# Patient Record
Sex: Male | Born: 1970 | Race: White | Hispanic: No | Marital: Married | Smoking: Former smoker
Health system: Southern US, Community
[De-identification: ages and names within clinical notes are randomized; demographics above are authoritative.]

## PROBLEM LIST (undated history)

## (undated) DIAGNOSIS — B029 Zoster without complications: Secondary | ICD-10-CM

## (undated) DIAGNOSIS — L409 Psoriasis, unspecified: Secondary | ICD-10-CM

## (undated) DIAGNOSIS — F419 Anxiety disorder, unspecified: Secondary | ICD-10-CM

## (undated) DIAGNOSIS — E291 Testicular hypofunction: Secondary | ICD-10-CM

## (undated) HISTORY — DX: Zoster without complications: B02.9

## (undated) HISTORY — DX: Psoriasis, unspecified: L40.9

## (undated) HISTORY — DX: Testicular hypofunction: E29.1

## (undated) HISTORY — DX: Anxiety disorder, unspecified: F41.9

---
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
## 2010-11-14 DIAGNOSIS — L409 Psoriasis, unspecified: Secondary | ICD-10-CM

## 2010-11-14 HISTORY — DX: Psoriasis, unspecified: L40.9

## 2015-11-15 DIAGNOSIS — B029 Zoster without complications: Secondary | ICD-10-CM

## 2015-11-15 HISTORY — DX: Zoster without complications: B02.9

## 2016-11-29 DIAGNOSIS — Z79891 Long term (current) use of opiate analgesic: Secondary | ICD-10-CM | POA: Diagnosis not present

## 2016-12-08 DIAGNOSIS — Z79891 Long term (current) use of opiate analgesic: Secondary | ICD-10-CM | POA: Diagnosis not present

## 2016-12-28 DIAGNOSIS — Z79891 Long term (current) use of opiate analgesic: Secondary | ICD-10-CM | POA: Diagnosis not present

## 2017-01-23 DIAGNOSIS — Z79891 Long term (current) use of opiate analgesic: Secondary | ICD-10-CM | POA: Diagnosis not present

## 2017-01-25 DIAGNOSIS — Z79891 Long term (current) use of opiate analgesic: Secondary | ICD-10-CM | POA: Diagnosis not present

## 2017-02-20 DIAGNOSIS — Z79891 Long term (current) use of opiate analgesic: Secondary | ICD-10-CM | POA: Diagnosis not present

## 2017-02-22 DIAGNOSIS — Z79891 Long term (current) use of opiate analgesic: Secondary | ICD-10-CM | POA: Diagnosis not present

## 2017-03-20 DIAGNOSIS — Z79891 Long term (current) use of opiate analgesic: Secondary | ICD-10-CM | POA: Diagnosis not present

## 2017-03-22 DIAGNOSIS — Z79891 Long term (current) use of opiate analgesic: Secondary | ICD-10-CM | POA: Diagnosis not present

## 2017-04-17 DIAGNOSIS — Z79891 Long term (current) use of opiate analgesic: Secondary | ICD-10-CM | POA: Diagnosis not present

## 2017-04-26 DIAGNOSIS — Z79891 Long term (current) use of opiate analgesic: Secondary | ICD-10-CM | POA: Diagnosis not present

## 2017-05-22 DIAGNOSIS — Z79891 Long term (current) use of opiate analgesic: Secondary | ICD-10-CM | POA: Diagnosis not present

## 2017-05-24 DIAGNOSIS — Z79891 Long term (current) use of opiate analgesic: Secondary | ICD-10-CM | POA: Diagnosis not present

## 2017-06-19 DIAGNOSIS — Z79891 Long term (current) use of opiate analgesic: Secondary | ICD-10-CM | POA: Diagnosis not present

## 2017-06-21 DIAGNOSIS — Z79891 Long term (current) use of opiate analgesic: Secondary | ICD-10-CM | POA: Diagnosis not present

## 2017-06-24 DIAGNOSIS — N410 Acute prostatitis: Secondary | ICD-10-CM | POA: Diagnosis not present

## 2017-07-10 ENCOUNTER — Ambulatory Visit (INDEPENDENT_AMBULATORY_CARE_PROVIDER_SITE_OTHER): Payer: 59 | Admitting: Family Medicine

## 2017-07-10 ENCOUNTER — Encounter: Payer: Self-pay | Admitting: Family Medicine

## 2017-07-10 VITALS — BP 144/90 | HR 96 | Temp 98.1°F | Resp 16 | Ht 75.5 in | Wt 285.0 lb

## 2017-07-10 DIAGNOSIS — L409 Psoriasis, unspecified: Secondary | ICD-10-CM | POA: Diagnosis not present

## 2017-07-10 DIAGNOSIS — Z7689 Persons encountering health services in other specified circumstances: Secondary | ICD-10-CM | POA: Diagnosis not present

## 2017-07-10 DIAGNOSIS — Z114 Encounter for screening for human immunodeficiency virus [HIV]: Secondary | ICD-10-CM

## 2017-07-10 DIAGNOSIS — E669 Obesity, unspecified: Secondary | ICD-10-CM

## 2017-07-10 DIAGNOSIS — R030 Elevated blood-pressure reading, without diagnosis of hypertension: Secondary | ICD-10-CM

## 2017-07-10 DIAGNOSIS — F419 Anxiety disorder, unspecified: Secondary | ICD-10-CM

## 2017-07-10 DIAGNOSIS — Z6835 Body mass index (BMI) 35.0-35.9, adult: Secondary | ICD-10-CM

## 2017-07-10 MED ORDER — CLOBETASOL PROPIONATE 0.05 % EX OINT: 1.0000 "application " | TOPICAL_OINTMENT | Freq: Two times a day (BID) | CUTANEOUS | 0 refills | Status: DC

## 2017-07-10 MED ORDER — CLOBETASOL PROPIONATE 0.05 % EX SOLN: 1.0000 "application " | Freq: Two times a day (BID) | CUTANEOUS | 0 refills | Status: DC

## 2017-07-10 NOTE — Assessment & Plan Note (Signed)
Stable on clobetasol Advised on precautions for infectious symptoms and joint symptoms

## 2017-07-10 NOTE — Progress Notes (Signed)
Patient: Ronald Mccoy, Male    DOB: February 10, 1971, 46 y.o.   MRN: [id number] Visit Date: 07/10/2017  Today's Provider: Shirlee Latch, MD   Chief Complaint  Patient presents with  . Establish Care  . Annual Exam   Subjective:    Annual physical exam Ronald Mccoy is a 46 y.o. male who presents today for health maintenance and complete physical. He feels well (other than white coat syndrome). He reports he was exercising on the elliptical for 10 minutes per night, but he has not done this in about 2 weeks. He reports he is sleeping well.  Pt went to [facility] on 06/24/2017 for prostatitis. He was treated with Bactrim, with relief of symptoms. They advised pt to FU with PCP and get a physical.. He was seeing Doristine Mango, NP at [facility], but pt prefers to see an MD. ----------------------------------------------------------------- Anxiety Previously tried Buspar, Klonopin, Effexor - didn't seem to help Not interested in medications at this time  Psoriasis - using clobetasol BID which keeps it under control - no joint symptoms   Review of Systems  Constitutional: Negative.   HENT: Negative.   Eyes: Negative.   Respiratory: Negative.   Cardiovascular: Negative.   Gastrointestinal: Negative.   Endocrine: Negative.   Genitourinary: Negative.   Musculoskeletal: Negative.   Skin: Negative.   Allergic/Immunologic: Negative.   Neurological: Negative.   Hematological: Negative.   Psychiatric/Behavioral: Negative.     Social History      He  reports that he has quit smoking. He has never used smokeless tobacco. He reports that he does not drink alcohol or use drugs.       Social History   Social History  . Marital status: Married    Spouse name: April  . Number of children: 2  . Years of education: 14   Occupational History  .  [employer]   Social History Main Topics  . Smoking status: Former Games developer  . Smokeless tobacco: Never Used     Comment: was social smoker  . Alcohol use No  . Drug use: No  . Sexual activity: Not Asked   Other Topics Concern  . None   Social History Narrative  . None    History reviewed. No pertinent past medical history.   There are no active problems to display for this patient.   Past Surgical History:  Procedure Laterality Date  . TONSILLECTOMY      Family History        Family Status  Relation Status  . Mother Alive  . Father Deceased  . Brother Alive  . Brother Alive  . Brother Alive        His family history includes Breast cancer in his mother; COPD in his father; Cirrhosis in his father; Healthy in his brother, brother, and brother; Heart attack (age of onset: 73) in his father.     No Known Allergies   Current Outpatient Prescriptions:  Marland Kitchen  Multiple Vitamin (MULTIVITAMIN) tablet, Take 1 tablet by mouth daily., Disp: , Rfl:  .  Probiotic Product (PROBIOTIC ADVANCED PO), Take by mouth., Disp: , Rfl:    Patient Care Team: Erasmo Downer, MD as PCP - General (Family Medicine)      Objective:   Vitals: BP (!) 144/90 (BP Location: Left Arm, Patient Position: Sitting, Cuff Size: Large) Comment: white coat syndrome per pt  Pulse 96   Temp 98.1 F (36.7 C) (Oral)  Resp 16   Ht 6' 3.5" (1.918 m)   Wt 285 lb (129.3 kg)   SpO2 96%   BMI 35.15 kg/m    Vitals:   07/10/17 1454  BP: (!) 144/90  Pulse: 96  Resp: 16  Temp: 98.1 F (36.7 C)  TempSrc: Oral  SpO2: 96%  Weight: 285 lb (129.3 kg)  Height: 6' 3.5" (1.918 m)     Physical Exam  Constitutional: He is oriented to person, place, and time. He appears well-developed and well-nourished. No distress.  HENT:  Head: Normocephalic and atraumatic.  Right Ear: External ear normal.  Left Ear: External ear normal.  Nose: Nose normal.  Mouth/Throat: Oropharynx is clear and moist.  Eyes: Conjunctivae and EOM are normal. No scleral icterus.  Neck: Neck supple. No thyromegaly present.  Cardiovascular:  Normal rate, regular rhythm, normal heart sounds and intact distal pulses.   No murmur heard. Pulmonary/Chest: Effort normal and breath sounds normal. No respiratory distress. He has no wheezes. He has no rales.  Abdominal: Soft. Bowel sounds are normal. He exhibits no distension. There is no tenderness. There is no rebound and no guarding.  Musculoskeletal: He exhibits no edema or deformity.  Lymphadenopathy:    He has no cervical adenopathy.  Neurological: He is alert and oriented to person, place, and time.  Skin: Skin is warm and dry.  + plaque psoriasis  Psychiatric: He has a normal mood and affect. His behavior is normal.  Vitals reviewed.   Depression Screen PHQ 2/9 Scores 07/10/2017  PHQ - 2 Score 0     Assessment & Plan:     Routine Health Maintenance and Physical Exam  Exercise Activities and Dietary recommendations Goals    None       There is no immunization history on file for this patient.  Health Maintenance  Topic Date Due  . HIV Screening  05/05/1987  . TETANUS/TDAP  05/05/1991  . INFLUENZA VACCINE  06/14/2017     Discussed health benefits of physical activity, and encouraged him to engage in regular exercise appropriate for his age and condition.   Psoriasis Stable on clobetasol Advised on precautions for infectious symptoms and joint symptoms  Obesity Discussed healthy diet and exercise Screening lipid panel, CMP  Elevated BP without diagnosis of hypertension We will monitor Patient reports this is white coat HTN - wife is RN who states BP is normal at home Could consider ambulatory BP monitoring  Anxiety Refuses medications at this time Consider therapy in the future    Addressed extensive list of chronic and acute medical problems today requiring extensive time in counseling and coordination care.  Over half of this 45 minute visit were spent in counseling and coordinating care of multiple medical  problems. --------------------------------------------------------------------  The entirety of the information documented in the History of Present Illness, Review of Systems and Physical Exam were personally obtained by me. Portions of this information were initially documented by Irving Burton Ratchford, CMA and reviewed by me for thoroughness and accuracy.    Shirlee Latch, MD  [facility]

## 2017-07-10 NOTE — Patient Instructions (Signed)
Psoriasis Psoriasis is a long-term (chronic) condition of skin inflammation. It occurs because your immune system causes skin cells to form too quickly. As a result, too many skin cells grow and create raised, red patches (plaques) that look silvery on your skin. Plaques may appear anywhere on your body. They can be any size or shape. Psoriasis can come and go. The condition varies from mild to very severe. It cannot be passed from one person to another (not contagious). What are the causes? The cause of psoriasis is not known, but certain factors can make the condition worse. These include:  Damage or trauma to the skin, such as cuts, scrapes, sunburn, and dryness.  Lack of sunlight.  Certain medicines.  Alcohol.  Tobacco use.  Stress.  Infections caused by bacteria or viruses.  What increases the risk? This condition is more likely to develop in:  People with a family history of psoriasis.  People who are Caucasian.  People who are between the ages of 15-30 and 50-60 years old.  What are the signs or symptoms? There are five different types of psoriasis. You can have more than one type of psoriasis during your life. Types are:  Plaque.  Guttate.  Inverse.  Pustular.  Erythrodermic.  Each type of psoriasis has different symptoms.  Plaque psoriasis symptoms include red, raised plaques with a silvery white coating (scale). These plaques may be itchy. Your nails may be pitted and crumbly or fall off.  Guttate psoriasis symptoms include small red spots that often show up on your trunk, arms, and legs. These spots may develop after you have been sick, especially with strep throat.  Inverse psoriasis symptoms include plaques in your underarm area, under your breasts, or on your genitals, groin, or buttocks.  Pustular psoriasis symptoms include pus-filled bumps that are painful, red, and swollen on the palms of your hands or the soles of your feet. You also may feel  exhausted, feverish, weak, or have no appetite.  Erythrodermic psoriasis symptoms include bright red skin that may look burned. You may have a fast heartbeat and a body temperature that is too high or too low. You may be itchy or in pain.  How is this diagnosed? Your health care provider may suspect psoriasis based on your symptoms and family history. Your health care provider will also do a physical exam. This may include a procedure to remove a tissue sample (biopsy) for testing. You may also be referred to a health care provider who specializes in skin diseases (dermatologist). How is this treated? There is no cure for this condition, but treatment can help manage it. Goals of treatment include:  Helping your skin heal.  Reducing itching and inflammation.  Slowing the growth of new skin cells.  Helping your immune system respond better to your skin.  Treatment varies, depending on the severity of your condition. Treatment may include:  Creams or ointments.  Ultraviolet ray exposure (light therapy). This may include natural sunlight or light therapy in a medical office.  Medicines (systemic therapy). These medicines can help your body better manage skin cell turnover and inflammation. They may be used along with light therapy or ointments. You may also get antibiotic medicines if you have an infection.  Follow these instructions at home: Skin Care  Moisturize your skin as needed. Only use moisturizers that have been approved by your health care provider.  Apply cool compresses to the affected areas.  Do not scratch your skin. Lifestyle   Do not   use tobacco products. This includes cigarettes, chewing tobacco, and e-cigarettes. If you need help quitting, ask your health care provider.  Drink little or no alcohol.  Try techniques for stress reduction, such as meditation or yoga.  Get exposure to the sun as told by your health care provider. Do not get sunburned.  Consider  joining a psoriasis support group. Medicines  Take or use over-the-counter and prescription medicines only as told by your health care provider.  If you were prescribed an antibiotic, take or use it as told by your health care provider. Do not stop taking the antibiotic even if your condition starts to improve. General instructions  Keep a journal to help track what triggers an outbreak. Try to avoid any triggers.  See a counselor or social worker if feelings of sadness, frustration, and hopelessness about your condition are interfering with your work and relationships.  Keep all follow-up visits as told by your health care provider. This is important. Contact a health care provider if:  Your pain gets worse.  You have increasing redness or warmth in the affected areas.  You have new or worsening pain or stiffness in your joints.  Your nails start to break easily or pull away from the nail bed.  You have a fever.  You feel depressed. This information is not intended to replace advice given to you by your health care provider. Make sure you discuss any questions you have with your health care provider. Document Released: 10/28/2000 Document Revised: 04/07/2016 Document Reviewed: 03/18/2015 Elsevier Interactive Patient Education  2018 [employer]  

## 2017-07-11 LAB — CBC WITH DIFFERENTIAL/PLATELET
BASOS ABS: 0 10*3/uL (ref 0.0–0.2)
Basos: 0 %
EOS (ABSOLUTE): 0.2 10*3/uL (ref 0.0–0.4)
Eos: 2 %
HEMATOCRIT: 41.3 % (ref 37.5–51.0)
HEMOGLOBIN: 14.5 g/dL (ref 13.0–17.7)
Immature Grans (Abs): 0 10*3/uL (ref 0.0–0.1)
Immature Granulocytes: 0 %
LYMPHS ABS: 2.7 10*3/uL (ref 0.7–3.1)
Lymphs: 29 %
MCH: 28.8 pg (ref 26.6–33.0)
MCHC: 35.1 g/dL (ref 31.5–35.7)
MCV: 82 fL (ref 79–97)
MONOCYTES: 10 %
MONOS ABS: 1 10*3/uL — AB (ref 0.1–0.9)
NEUTROS ABS: 5.5 10*3/uL (ref 1.4–7.0)
Neutrophils: 59 %
Platelets: 253 10*3/uL (ref 150–379)
RBC: 5.04 x10E6/uL (ref 4.14–5.80)
RDW: 13.1 % (ref 12.3–15.4)
WBC: 9.4 10*3/uL (ref 3.4–10.8)

## 2017-07-11 LAB — HIV ANTIBODY (ROUTINE TESTING W REFLEX): HIV Screen 4th Generation wRfx: NONREACTIVE

## 2017-07-11 LAB — COMPREHENSIVE METABOLIC PANEL
A/G RATIO: 1.2 (ref 1.2–2.2)
ALK PHOS: 79 IU/L (ref 39–117)
ALT: 15 IU/L (ref 0–44)
AST: 22 IU/L (ref 0–40)
Albumin: 4.4 g/dL (ref 3.5–5.5)
BILIRUBIN TOTAL: 0.4 mg/dL (ref 0.0–1.2)
BUN/Creatinine Ratio: 11 (ref 9–20)
BUN: 9 mg/dL (ref 6–24)
CHLORIDE: 98 mmol/L (ref 96–106)
CO2: 27 mmol/L (ref 20–29)
Calcium: 9.5 mg/dL (ref 8.7–10.2)
Creatinine, Ser: 0.85 mg/dL (ref 0.76–1.27)
GFR calc non Af Amer: 105 mL/min/{1.73_m2} (ref >59)
GFR, EST AFRICAN AMERICAN: 122 mL/min/{1.73_m2} (ref >59)
GLOBULIN, TOTAL: 3.8 g/dL (ref 1.5–4.5)
GLUCOSE: 109 mg/dL — AB (ref 65–99)
Potassium: 4.3 mmol/L (ref 3.5–5.2)
Sodium: 140 mmol/L (ref 134–144)
TOTAL PROTEIN: 8.2 g/dL (ref 6.0–8.5)

## 2017-07-11 LAB — LIPID PANEL
CHOL/HDL RATIO: 3.8 ratio (ref 0.0–5.0)
CHOLESTEROL TOTAL: 145 mg/dL (ref 100–199)
HDL: 38 mg/dL — ABNORMAL LOW (ref >39)
LDL CALC: 76 mg/dL (ref 0–99)
Triglycerides: 157 mg/dL — ABNORMAL HIGH (ref 0–149)
VLDL Cholesterol Cal: 31 mg/dL (ref 5–40)

## 2017-07-11 NOTE — Assessment & Plan Note (Signed)
We will monitor Patient reports this is white coat HTN - wife is RN who states BP is normal at home Could consider ambulatory BP monitoring

## 2017-07-11 NOTE — Assessment & Plan Note (Signed)
Refuses medications at this time Consider therapy in the future

## 2017-07-11 NOTE — Assessment & Plan Note (Signed)
Discussed healthy diet and exercise Screening lipid panel, CMP

## 2017-07-12 ENCOUNTER — Encounter: Payer: Self-pay | Admitting: Family Medicine

## 2017-07-18 DIAGNOSIS — Z79891 Long term (current) use of opiate analgesic: Secondary | ICD-10-CM | POA: Diagnosis not present

## 2017-07-19 DIAGNOSIS — Z79891 Long term (current) use of opiate analgesic: Secondary | ICD-10-CM | POA: Diagnosis not present

## 2017-08-11 DIAGNOSIS — M5430 Sciatica, unspecified side: Secondary | ICD-10-CM | POA: Diagnosis not present

## 2017-08-11 DIAGNOSIS — M791 Myalgia: Secondary | ICD-10-CM | POA: Diagnosis not present

## 2017-08-11 DIAGNOSIS — M9905 Segmental and somatic dysfunction of pelvic region: Secondary | ICD-10-CM | POA: Diagnosis not present

## 2017-08-15 DIAGNOSIS — Z79891 Long term (current) use of opiate analgesic: Secondary | ICD-10-CM | POA: Diagnosis not present

## 2017-08-27 ENCOUNTER — Encounter: Payer: Self-pay | Admitting: Family Medicine

## 2017-11-29 DIAGNOSIS — Z79891 Long term (current) use of opiate analgesic: Secondary | ICD-10-CM | POA: Diagnosis not present

## 2017-12-08 ENCOUNTER — Encounter: Payer: Self-pay | Admitting: Family Medicine

## 2018-01-02 DIAGNOSIS — Z79899 Other long term (current) drug therapy: Secondary | ICD-10-CM | POA: Diagnosis not present

## 2018-01-15 DIAGNOSIS — D485 Neoplasm of uncertain behavior of skin: Secondary | ICD-10-CM | POA: Diagnosis not present

## 2018-01-15 DIAGNOSIS — D034 Melanoma in situ of scalp and neck: Secondary | ICD-10-CM | POA: Diagnosis not present

## 2018-01-15 DIAGNOSIS — C44619 Basal cell carcinoma of skin of left upper limb, including shoulder: Secondary | ICD-10-CM | POA: Diagnosis not present

## 2018-01-15 DIAGNOSIS — L570 Actinic keratosis: Secondary | ICD-10-CM | POA: Diagnosis not present

## 2018-01-15 DIAGNOSIS — L400 Psoriasis vulgaris: Secondary | ICD-10-CM | POA: Diagnosis not present

## 2018-01-15 DIAGNOSIS — L409 Psoriasis, unspecified: Secondary | ICD-10-CM | POA: Diagnosis not present

## 2018-01-31 DIAGNOSIS — Z79899 Other long term (current) drug therapy: Secondary | ICD-10-CM | POA: Diagnosis not present

## 2018-02-02 DIAGNOSIS — D034 Melanoma in situ of scalp and neck: Secondary | ICD-10-CM | POA: Diagnosis not present

## 2018-02-02 DIAGNOSIS — L905 Scar conditions and fibrosis of skin: Secondary | ICD-10-CM | POA: Diagnosis not present

## 2018-02-16 DIAGNOSIS — C4441 Basal cell carcinoma of skin of scalp and neck: Secondary | ICD-10-CM | POA: Diagnosis not present

## 2018-02-28 DIAGNOSIS — Z79899 Other long term (current) drug therapy: Secondary | ICD-10-CM | POA: Diagnosis not present

## 2018-03-21 DIAGNOSIS — Z79891 Long term (current) use of opiate analgesic: Secondary | ICD-10-CM | POA: Diagnosis not present

## 2018-04-25 DIAGNOSIS — Z79891 Long term (current) use of opiate analgesic: Secondary | ICD-10-CM | POA: Diagnosis not present

## 2018-05-23 DIAGNOSIS — Z79891 Long term (current) use of opiate analgesic: Secondary | ICD-10-CM | POA: Diagnosis not present

## 2018-06-19 DIAGNOSIS — D2262 Melanocytic nevi of left upper limb, including shoulder: Secondary | ICD-10-CM | POA: Diagnosis not present

## 2018-06-19 DIAGNOSIS — Z79891 Long term (current) use of opiate analgesic: Secondary | ICD-10-CM | POA: Diagnosis not present

## 2018-06-19 DIAGNOSIS — D485 Neoplasm of uncertain behavior of skin: Secondary | ICD-10-CM | POA: Diagnosis not present

## 2018-06-19 DIAGNOSIS — Z85820 Personal history of malignant melanoma of skin: Secondary | ICD-10-CM | POA: Diagnosis not present

## 2018-06-19 DIAGNOSIS — Z85828 Personal history of other malignant neoplasm of skin: Secondary | ICD-10-CM | POA: Diagnosis not present

## 2018-06-19 DIAGNOSIS — C44612 Basal cell carcinoma of skin of right upper limb, including shoulder: Secondary | ICD-10-CM | POA: Diagnosis not present

## 2018-07-09 DIAGNOSIS — C44612 Basal cell carcinoma of skin of right upper limb, including shoulder: Secondary | ICD-10-CM | POA: Diagnosis not present

## 2018-07-11 ENCOUNTER — Encounter: Payer: Self-pay | Admitting: Family Medicine

## 2018-07-17 DIAGNOSIS — Z79891 Long term (current) use of opiate analgesic: Secondary | ICD-10-CM | POA: Diagnosis not present

## 2018-08-14 DIAGNOSIS — Z79891 Long term (current) use of opiate analgesic: Secondary | ICD-10-CM | POA: Diagnosis not present

## 2018-09-06 DIAGNOSIS — Z79891 Long term (current) use of opiate analgesic: Secondary | ICD-10-CM | POA: Diagnosis not present

## 2018-10-03 DIAGNOSIS — Z79891 Long term (current) use of opiate analgesic: Secondary | ICD-10-CM | POA: Diagnosis not present

## 2018-10-25 ENCOUNTER — Encounter: Payer: 59 | Admitting: Family Medicine

## 2018-10-30 DIAGNOSIS — Z79891 Long term (current) use of opiate analgesic: Secondary | ICD-10-CM | POA: Diagnosis not present

## 2018-11-27 DIAGNOSIS — Z79891 Long term (current) use of opiate analgesic: Secondary | ICD-10-CM | POA: Diagnosis not present

## 2018-12-28 ENCOUNTER — Encounter: Payer: Self-pay | Admitting: Family Medicine

## 2018-12-28 ENCOUNTER — Ambulatory Visit (INDEPENDENT_AMBULATORY_CARE_PROVIDER_SITE_OTHER): Payer: 59 | Admitting: Family Medicine

## 2018-12-28 VITALS — BP 138/97 | HR 125 | Temp 98.7°F | Wt 299.2 lb

## 2018-12-28 DIAGNOSIS — E669 Obesity, unspecified: Secondary | ICD-10-CM

## 2018-12-28 DIAGNOSIS — F1120 Opioid dependence, uncomplicated: Secondary | ICD-10-CM | POA: Insufficient documentation

## 2018-12-28 DIAGNOSIS — D049 Carcinoma in situ of skin, unspecified: Secondary | ICD-10-CM

## 2018-12-28 DIAGNOSIS — F419 Anxiety disorder, unspecified: Secondary | ICD-10-CM

## 2018-12-28 DIAGNOSIS — R739 Hyperglycemia, unspecified: Secondary | ICD-10-CM

## 2018-12-28 DIAGNOSIS — Z86006 Personal history of melanoma in-situ: Secondary | ICD-10-CM

## 2018-12-28 DIAGNOSIS — Z6836 Body mass index (BMI) 36.0-36.9, adult: Secondary | ICD-10-CM

## 2018-12-28 DIAGNOSIS — L409 Psoriasis, unspecified: Secondary | ICD-10-CM

## 2018-12-28 DIAGNOSIS — L309 Dermatitis, unspecified: Secondary | ICD-10-CM | POA: Insufficient documentation

## 2018-12-28 DIAGNOSIS — Z0000 Encounter for general adult medical examination without abnormal findings: Secondary | ICD-10-CM

## 2018-12-28 MED ORDER — ALPRAZOLAM 0.5 MG PO TABS: 0.5000 mg | ORAL_TABLET | Freq: Two times a day (BID) | ORAL | 0 refills | Status: DC | PRN

## 2018-12-28 NOTE — Patient Instructions (Signed)
Preventive Care 40-64 Years, Male Preventive care refers to lifestyle choices and visits with your health care provider that can promote health and wellness. What does preventive care include?   A yearly physical exam. This is also called an annual well check.  Dental exams once or twice a year.  Routine eye exams. Ask your health care provider how often you should have your eyes checked.  Personal lifestyle choices, including: ? Daily care of your teeth and gums. ? Regular physical activity. ? Eating a healthy diet. ? Avoiding tobacco and drug use. ? Limiting alcohol use. ? Practicing safe sex. ? Taking low-dose aspirin every day starting at age 50. What happens during an annual well check? The services and screenings done by your health care provider during your annual well check will depend on your age, overall health, lifestyle risk factors, and family history of disease. Counseling Your health care provider may ask you questions about your:  Alcohol use.  Tobacco use.  Drug use.  Emotional well-being.  Home and relationship well-being.  Sexual activity.  Eating habits.  Work and work environment. Screening You may have the following tests or measurements:  Height, weight, and BMI.  Blood pressure.  Lipid and cholesterol levels. These may be checked every 5 years, or more frequently if you are over 50 years old.  Skin check.  Lung cancer screening. You may have this screening every year starting at age 55 if you have a 30-pack-year history of smoking and currently smoke or have quit within the past 15 years.  Colorectal cancer screening. All adults should have this screening starting at age 50 and continuing until age 75. Your health care provider may recommend screening at age 45. You will have tests every 1-10 years, depending on your results and the type of screening test. People at increased risk should start screening at an earlier age. Screening tests may  include: ? Guaiac-based fecal occult blood testing. ? Fecal immunochemical test (FIT). ? Stool DNA test. ? Virtual colonoscopy. ? Sigmoidoscopy. During this test, a flexible tube with a tiny camera (sigmoidoscope) is used to examine your rectum and lower colon. The sigmoidoscope is inserted through your anus into your rectum and lower colon. ? Colonoscopy. During this test, a long, thin, flexible tube with a tiny camera (colonoscope) is used to examine your entire colon and rectum.  Prostate cancer screening. Recommendations will vary depending on your family history and other risks.  Hepatitis C blood test.  Hepatitis B blood test.  Sexually transmitted disease (STD) testing.  Diabetes screening. This is done by checking your blood sugar (glucose) after you have not eaten for a while (fasting). You may have this done every 1-3 years. Discuss your test results, treatment options, and if necessary, the need for more tests with your health care provider. Vaccines Your health care provider may recommend certain vaccines, such as:  Influenza vaccine. This is recommended every year.  Tetanus, diphtheria, and acellular pertussis (Tdap, Td) vaccine. You may need a Td booster every 10 years.  Varicella vaccine. You may need this if you have not been vaccinated.  Zoster vaccine. You may need this after age 60.  Measles, mumps, and rubella (MMR) vaccine. You may need at least one dose of MMR if you were born in 1957 or later. You may also need a second dose.  Pneumococcal 13-valent conjugate (PCV13) vaccine. You may need this if you have certain conditions and have not been vaccinated.  Pneumococcal polysaccharide (PPSV23) vaccine.   You may need one or two doses if you smoke cigarettes or if you have certain conditions.  Meningococcal vaccine. You may need this if you have certain conditions.  Hepatitis A vaccine. You may need this if you have certain conditions or if you travel or work in  places where you may be exposed to hepatitis A.  Hepatitis B vaccine. You may need this if you have certain conditions or if you travel or work in places where you may be exposed to hepatitis B.  Haemophilus influenzae type b (Hib) vaccine. You may need this if you have certain risk factors. Talk to your health care provider about which screenings and vaccines you need and how often you need them. This information is not intended to replace advice given to you by your health care provider. Make sure you discuss any questions you have with your health care provider. Document Released: 11/27/2015 Document Revised: 12/21/2017 Document Reviewed: 09/01/2015 Elsevier Interactive Patient Education  2019 [employer] Inc.  

## 2018-12-28 NOTE — Progress Notes (Signed)
Patient: Ronald Mccoy, Male    DOB: November 11, 1971, 48 y.o.   MRN: [id number] Visit Date: 12/28/2018  Today's Provider: Shirlee Latch, MD   Chief Complaint  Patient presents with  . Annual Exam   Subjective:    I, [name], CMA, am acting as a scribe for Shirlee Latch, MD.    Annual physical exam Ronald Mccoy is a 48 y.o. male who presents today for health maintenance and complete physical. He feels fairly well. He reports no regular exercise. He reports he is sleeping well.  Worsening anxiety with some chest discomfort intermittently.  Notices that chest pain resolves when his anxiety improves.  Chest pain is sharp and lateral.  No SOB, N/V, diaphoresis.  He is tapering off of suboxone and this has significantly worsened anxiety.  He took Prozac and effexor in the past and it "killed libido." He also tried buspar and it worsened anxiety.  He is wary of medications.  Psoriasis: Seems to be worsening.  He is followed by Vaughan Sine.  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   -----------------------------------------------------------------   Review of Systems  Constitutional: Negative.   HENT: Negative.   Eyes: Negative.   Respiratory: Negative.   Cardiovascular: Positive for chest pain.  Gastrointestinal: Negative.   Endocrine: Negative.   Genitourinary: Negative.   Musculoskeletal: Negative.   Skin: Negative.   Allergic/Immunologic: Negative.   Neurological: Negative.   Hematological: Negative.   Psychiatric/Behavioral: The patient is nervous/anxious.     Social History      He  reports that he has quit smoking. His smoking use included cigarettes. He has never used smokeless tobacco. He reports that he does not drink alcohol or use drugs.       Social History   Socioeconomic History  . Marital status: Married    Spouse name: April  . Number of children: 2  . Years of education: 39  . Highest education level: Not on file    Occupational History    Employer: [employer]  Social Needs  . Financial resource strain: Not on file  . Food insecurity:    Worry: Not on file    Inability: Not on file  . Transportation needs:    Medical: Not on file    Non-medical: Not on file  Tobacco Use  . Smoking status: Former Smoker    Types: Cigarettes  . Smokeless tobacco: Never Used  . Tobacco comment: was social smoker  Substance and Sexual Activity  . Alcohol use: No  . Drug use: No  . Sexual activity: Not on file  Lifestyle  . Physical activity:    Days per week: Not on file    Minutes per session: Not on file  . Stress: Not on file  Relationships  . Social connections:    Talks on phone: Not on file    Gets together: Not on file    Attends religious service: Not on file    Active member of club or organization: Not on file    Attends meetings of clubs or organizations: Not on file    Relationship status: Not on file  Other Topics Concern  . Not on file  Social History Narrative  . Not on file    Past Medical History:  Diagnosis Date  . Psoriasis 2012  . Shingles 2017     Patient Active Problem List   Diagnosis Date Noted  . Narcotic addiction (HCC) 12/28/2018  .  Eczema 12/28/2018  . H/O melanoma in situ 12/28/2018  . 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 Anxiety 07/10/2017  . Psoriasis 07/10/2017  . Obesity 07/10/2017  . Elevated BP without diagnosis of hypertension 07/10/2017    Past Surgical History:  Procedure Laterality Date  . TONSILLECTOMY  1978    Family History        Family Status  Relation Name Status  . Mother  Alive  . Father  Deceased  . Brother  Alive  . Brother  Alive  . Brother  Alive        His family history includes Breast cancer in his mother; COPD in his father; Cirrhosis in his father; Healthy in his brother, brother, and brother; Heart attack (age of onset: 18) in his father.      No Known Allergies   Current Outpatient  Medications:  .  Buprenorphine HCl-Naloxone HCl (SUBOXONE) 8-2 MG FILM, Place under the tongue. 1 1/2 films daily, Disp: , Rfl:  .  clobetasol (TEMOVATE) 0.05 % external solution, Apply 1 application topically 2 (two) times daily., Disp: 50 mL, Rfl: 0 .  clobetasol ointment (TEMOVATE) 0.05 %, Apply 1 application topically 2 (two) times daily., Disp: 30 g, Rfl: 0 .  Multiple Vitamin (MULTIVITAMIN) tablet, Take 1 tablet by mouth daily., Disp: , Rfl:  .  Probiotic Product (PROBIOTIC ADVANCED PO), Take by mouth., Disp: , Rfl:    Patient Care Team: Erasmo Downer, MD as PCP - General (Family Medicine)    Objective:    Vitals: BP (!) 146/92 (BP Location: Right Arm, Patient Position: Sitting, Cuff Size: Large)   Pulse (!) 125   Temp 98.7 F (37.1 C) (Oral)   Wt 299 lb 3.2 oz (135.7 kg)   SpO2 99%   BMI 36.90 kg/m    Vitals:   12/28/18 1506  BP: (!) 146/92  Pulse: (!) 125  Temp: 98.7 F (37.1 C)  TempSrc: Oral  SpO2: 99%  Weight: 299 lb 3.2 oz (135.7 kg)     Physical Exam Vitals signs reviewed.  Constitutional:      General: He is not in acute distress.    Appearance: Normal appearance. He is well-developed. He is not diaphoretic.  HENT:     Head: Normocephalic and atraumatic.     Right Ear: Tympanic membrane, ear canal and external ear normal.     Left Ear: Tympanic membrane, ear canal and external ear normal.     Nose: Nose normal. No congestion.     Mouth/Throat:     Mouth: Mucous membranes are moist.     Pharynx: Oropharynx is clear. No oropharyngeal exudate.  Eyes:     General: No scleral icterus.    Conjunctiva/sclera: Conjunctivae normal.     Pupils: Pupils are equal, round, and reactive to light.  Neck:     Musculoskeletal: Neck supple.     Thyroid: No thyromegaly.  Cardiovascular:     Rate and Rhythm: Normal rate and regular rhythm.     Pulses: Normal pulses.     Heart sounds: Normal heart sounds. No murmur.  Pulmonary:     Effort: Pulmonary effort is  normal. No respiratory distress.     Breath sounds: Normal breath sounds. No wheezing or rales.  Abdominal:     General: Bowel sounds are normal. There is no distension.     Palpations: Abdomen is soft.     Tenderness: There is no abdominal tenderness. There is no guarding or  rebound.  Musculoskeletal:        General: No deformity.     Right lower leg: No edema.     Left lower leg: No edema.  Lymphadenopathy:     Cervical: No cervical adenopathy.  Skin:    General: Skin is warm and dry.     Capillary Refill: Capillary refill takes less than 2 seconds.     Findings: Rash (psoriasis) present.  Neurological:     Mental Status: He is alert and oriented to person, place, and time. Mental status is at baseline.  Psychiatric:        Mood and Affect: Mood normal.        Behavior: Behavior normal.        Thought Content: Thought content normal.     Depression Screen PHQ 2/9 Scores 12/28/2018 07/10/2017  PHQ - 2 Score 0 0  PHQ- 9 Score 0 -      Assessment & Plan:     Routine Health Maintenance and Physical Exam  Exercise Activities and Dietary recommendations Goals   None      There is no immunization history on file for this patient.  Health Maintenance  Topic Date Due  . INFLUENZA VACCINE  02/12/2019 (Originally 06/14/2018)  . TETANUS/TDAP  12/29/2019 (Originally 05/05/1991)  . HIV Screening  Completed     Discussed health benefits of physical activity, and encouraged him to engage in regular exercise appropriate for his age and condition.    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    Followed by Derm        Other   Anxiety    Heightened in setting of coming off of  suboxone Declines SSRI or SNRI Discussed using low dose Xanax very infrequently Will not use this chronically Discussed addictive nature      Relevant Medications   ALPRAZolam (XANAX) 0.5 MG tablet   Other Relevant Orders   Comprehensive metabolic panel (Completed)   TSH (Completed)   CBC w/Diff/Platelet (Completed)   Lipid panel (Completed)   Obesity    Discussed diet and exercise Discussed importance of weight loss screenign labs today      Relevant Orders   Lipid panel (Completed)    Other Visit Diagnoses    Encounter for annual physical exam    -  Primary   Relevant Orders   Comprehensive metabolic panel (Completed)   TSH (Completed)   CBC w/Diff/Platelet (Completed)   Lipid panel (Completed)   Hemoglobin A1c (Completed)   Hyperglycemia       Relevant Orders   Hemoglobin A1c (Completed)       Return in about 1 year (around 12/29/2019) for CPE.   The entirety of the information documented in the History of Present Illness, Review of Systems and Physical Exam were personally obtained by me. Portions of this information were initially documented by Presley RaddleNikki Walston, CMA and reviewed by me for thoroughness and accuracy.    Erasmo DownerBacigalupo, Royann Wildasin M, MD, MPH [facility] 01/01/2019 9:44 AM

## 2019-01-01 LAB — CBC WITH DIFFERENTIAL/PLATELET
Basophils Absolute: 0.1 10*3/uL (ref 0.0–0.2)
Basos: 1 %
EOS (ABSOLUTE): 0.2 10*3/uL (ref 0.0–0.4)
Eos: 2 %
Hematocrit: 44.8 % (ref 37.5–51.0)
Hemoglobin: 15.8 g/dL (ref 13.0–17.7)
Immature Grans (Abs): 0 10*3/uL (ref 0.0–0.1)
Immature Granulocytes: 0 %
LYMPHS: 28 %
Lymphocytes Absolute: 2.8 10*3/uL (ref 0.7–3.1)
MCH: 30 pg (ref 26.6–33.0)
MCHC: 35.3 g/dL (ref 31.5–35.7)
MCV: 85 fL (ref 79–97)
Monocytes Absolute: 1 10*3/uL — ABNORMAL HIGH (ref 0.1–0.9)
Monocytes: 10 %
Neutrophils Absolute: 5.8 10*3/uL (ref 1.4–7.0)
Neutrophils: 59 %
Platelets: 310 10*3/uL (ref 150–450)
RBC: 5.27 x10E6/uL (ref 4.14–5.80)
RDW: 12.8 % (ref 11.6–15.4)
WBC: 10 10*3/uL (ref 3.4–10.8)

## 2019-01-01 LAB — COMPREHENSIVE METABOLIC PANEL
A/G RATIO: 1.2 (ref 1.2–2.2)
ALT: 9 IU/L (ref 0–44)
AST: 20 IU/L (ref 0–40)
Albumin: 4.2 g/dL (ref 4.0–5.0)
Alkaline Phosphatase: 86 IU/L (ref 39–117)
BILIRUBIN TOTAL: 0.4 mg/dL (ref 0.0–1.2)
BUN/Creatinine Ratio: 12 (ref 9–20)
BUN: 9 mg/dL (ref 6–24)
CHLORIDE: 99 mmol/L (ref 96–106)
CO2: 24 mmol/L (ref 20–29)
Calcium: 9.3 mg/dL (ref 8.7–10.2)
Creatinine, Ser: 0.78 mg/dL (ref 0.76–1.27)
GFR calc non Af Amer: 108 mL/min/{1.73_m2} (ref >59)
GFR, EST AFRICAN AMERICAN: 125 mL/min/{1.73_m2} (ref >59)
Globulin, Total: 3.5 g/dL (ref 1.5–4.5)
Glucose: 119 mg/dL — ABNORMAL HIGH (ref 65–99)
POTASSIUM: 3.8 mmol/L (ref 3.5–5.2)
Sodium: 140 mmol/L (ref 134–144)
Total Protein: 7.7 g/dL (ref 6.0–8.5)

## 2019-01-01 LAB — LIPID PANEL
Chol/HDL Ratio: 3.7 ratio (ref 0.0–5.0)
Cholesterol, Total: 167 mg/dL (ref 100–199)
HDL: 45 mg/dL (ref >39)
LDL Calculated: 102 mg/dL — ABNORMAL HIGH (ref 0–99)
Triglycerides: 100 mg/dL (ref 0–149)
VLDL Cholesterol Cal: 20 mg/dL (ref 5–40)

## 2019-01-01 LAB — TSH: TSH: 1.52 u[IU]/mL (ref 0.450–4.500)

## 2019-01-01 LAB — HEMOGLOBIN A1C
Est. average glucose Bld gHb Est-mCnc: 117 mg/dL
Hgb A1c MFr Bld: 5.7 % — ABNORMAL HIGH (ref 4.8–5.6)

## 2019-01-01 NOTE — Assessment & Plan Note (Signed)
Discussed diet and exercise Discussed importance of weight loss screenign labs today

## 2019-01-01 NOTE — Assessment & Plan Note (Signed)
Followed by Derm

## 2019-01-01 NOTE — Assessment & Plan Note (Signed)
Continue topicals Advised to f/u with Derm No joint symptoms

## 2019-01-01 NOTE — Assessment & Plan Note (Signed)
Heightened in setting of coming off of suboxone Declines SSRI or SNRI Discussed using low dose Xanax very infrequently Will not use this chronically Discussed addictive nature

## 2019-06-10 ENCOUNTER — Encounter: Payer: Self-pay | Admitting: Family Medicine

## 2019-06-10 DIAGNOSIS — L409 Psoriasis, unspecified: Secondary | ICD-10-CM

## 2019-06-12 ENCOUNTER — Other Ambulatory Visit: Payer: Self-pay | Admitting: Family Medicine

## 2019-06-12 DIAGNOSIS — L409 Psoriasis, unspecified: Secondary | ICD-10-CM

## 2019-06-12 MED ORDER — CLOBETASOL PROPIONATE 0.05 % EX OINT: 1.0000 "application " | TOPICAL_OINTMENT | Freq: Two times a day (BID) | CUTANEOUS | 2 refills | Status: DC

## 2019-07-12 ENCOUNTER — Other Ambulatory Visit: Payer: Self-pay

## 2019-07-12 ENCOUNTER — Ambulatory Visit: Payer: 59 | Admitting: Family Medicine

## 2019-07-12 ENCOUNTER — Encounter: Payer: Self-pay | Admitting: Family Medicine

## 2019-07-12 VITALS — BP 148/89 | HR 96 | Temp 97.1°F | Ht 75.5 in | Wt 295.4 lb

## 2019-07-12 DIAGNOSIS — B3742 Candidal balanitis: Secondary | ICD-10-CM | POA: Diagnosis not present

## 2019-07-12 MED ORDER — NYSTATIN 100000 UNIT/GM EX OINT: 1.0000 "application " | TOPICAL_OINTMENT | Freq: Two times a day (BID) | CUTANEOUS | 0 refills | Status: DC

## 2019-07-12 NOTE — Patient Instructions (Signed)
Genital Yeast Infection, Male In men, a genital yeast infection is a condition that causes soreness, swelling, and redness (inflammation) of the head of the penis (glans penis). A genital yeast infection can be spread through sexual contact, but it can also develop without sexual contact. If the infection is not treated properly, it is likely to come back. What are the causes? This condition is caused by a change in the normal balance of the yeast and bacteria that live on the skin. This change causes an overgrowth of yeast, which causes the inflammation. Many types of yeast can cause this infection, but Candida is the most common. What increases the risk? The following factors may make you more likely to develop this condition:  Taking antibiotics.  Having diabetes.  Being exposed to the infection by a sexual partner.  Being uncircumcised.  Having a weak body defense system (immune system).  Taking steroid medicines for a long time.  Having poor hygiene. What are the signs or symptoms? Symptoms of this condition include:  Itching of the groin and penis.  Dry, red, or cracked skin on the penis.  Swelling of the genital area.  Pain while urinating or difficulty urinating.  Thick, bad-smelling discharge on the penis. How is this diagnosed? This condition may be diagnosed based on:  Your medical history.  A physical exam. You may also have tests, such as:  Test of a sample of discharge from the penis.  Urine tests.  Blood tests. How is this treated? This condition is treated with:  Anti-fungal creams or medicines. Anti-fungal medicines may be prescribed by your health care provider or they may be available over-the-counter.  Self-care at home. For men who are not circumcised, circumcision may be recommended to control infections that return and are difficult to treat. Follow these instructions at home: Medicines   Take or apply over-the-counter and prescription  medicines only as told by your health care provider.  Take your anti-fungal medicine as told by your health care provider. Do not stop taking the medicine even if you start to feel better. Self care  Wash your penis with soap and water every day. If you are not circumcised, pull back the foreskin to wash. Make sure to dry your penis completely after washing.  Wear breathable, cotton underwear.  Keep your underwear clean and dry. General instructions  Do not have sex until your health care provider has approved. Tell your sexual partner that you have a yeast infection. That person should go for treatment even if no symptoms are present.  If you have diabetes, keep your blood sugar levels within your target range.  Keep all follow-up visits as told by your health care provider. This is important. Contact a health care provider if you:  Have a fever.  Have symptoms that go away and then return.  Do not get better with treatment.  Have symptoms that get worse.  Have new symptoms. Get help right away if:  Your swelling and inflammation become so severe that you cannot urinate. Summary  In men, a genital yeast infection is a condition that causes soreness, swelling, and redness (inflammation) of the head of the penis (glans penis).  This condition is caused by a change in the normal balance of the yeast and bacteria that live on the skin. This change causes an overgrowth of yeast, which causes the inflammation.  A genital yeast infection usually spreads through sexual contact, but it can develop without sexual contact. For instance, you may   be more likely to develop this infection if you take antibiotics or steroids, have diabetes, are not circumcised, have a weak immune system, or have poor hygiene.  This condition is treated with anti-fungal cream or pills along with self-care at home. This information is not intended to replace advice given to you by your health care provider.  Make sure you discuss any questions you have with your health care provider. Document Released: 12/08/2004 Document Revised: 12/05/2017 Document Reviewed: 12/05/2017 Elsevier Patient Education  2020 Elsevier Inc[employer]  

## 2019-07-12 NOTE — Progress Notes (Signed)
Patient: Ronald Mccoy Male    DOB: 1970-11-25   48 y.o.   MRN: [id number] Visit Date: 07/12/2019  Today's Provider: Lavon Paganini, MD   Chief Complaint  Patient presents with  . Male GU Problem   Subjective:    I, Tiburcio Pea, CMA, am acting as a scribe for Lavon Paganini, MD.    Male GU Problem The patient's primary symptoms include genital lesions and pelvic pain. This is a new problem. Episode onset: Sunday. The problem occurs constantly. The problem has been unchanged. Associated symptoms include constipation and a rash. He is sexually active. His past medical history is significant for prostatitis.   Intermittent pelvic soreness Tried ibuprofen and it helped with pain some  2 ulcers on penis Burns with washing Tried clobetasol and vagisil - helped a bit  Monogamous relationship No dysuria Unchanged   No Known Allergies   Current Outpatient Medications:  .  ALPRAZolam (XANAX) 0.5 MG tablet, Take 1 tablet (0.5 mg total) by mouth 2 (two) times daily as needed for anxiety., Disp: 15 tablet, Rfl: 0 .  Buprenorphine HCl-Naloxone HCl (SUBOXONE) 8-2 MG FILM, Place under the tongue. 1 1/2 films daily, Disp: , Rfl:  .  clobetasol (TEMOVATE) 0.05 % external solution, Apply 1 application topically 2 (two) times daily., Disp: 50 mL, Rfl: 0 .  clobetasol ointment (TEMOVATE) 0.86 %, Apply 1 application topically 2 (two) times daily., Disp: 30 g, Rfl: 2 .  Multiple Vitamin (MULTIVITAMIN) tablet, Take 1 tablet by mouth daily., Disp: , Rfl:  .  Probiotic Product (PROBIOTIC ADVANCED PO), Take by mouth., Disp: , Rfl:   Review of Systems  Constitutional: Negative.   Respiratory: Negative.   Cardiovascular: Negative.   Gastrointestinal: Positive for constipation.  Genitourinary: Positive for genital sores and pelvic pain.       Pelvic pain   Musculoskeletal: Negative.   Skin: Positive for rash.    Social History   Tobacco Use  . Smoking status: Former Smoker   Types: Cigarettes  . Smokeless tobacco: Never Used  . Tobacco comment: was social smoker  Substance Use Topics  . Alcohol use: No      Objective:   BP (!) 148/89 (BP Location: Right Arm, Patient Position: Sitting, Cuff Size: Large)   Pulse 96   Temp (!) 97.1 F (36.2 C) (Oral)   Ht 6' 3.5" (1.918 m)   Wt 295 lb 6.4 oz (134 kg)   SpO2 98%   BMI 36.44 kg/m  Vitals:   07/12/19 1400  BP: (!) 148/89  Pulse: 96  Temp: (!) 97.1 F (36.2 C)  TempSrc: Oral  SpO2: 98%  Weight: 295 lb 6.4 oz (134 kg)  Height: 6' 3.5" (1.918 m)     Physical Exam Vitals signs reviewed.  Constitutional:      General: He is not in acute distress.    Appearance: Normal appearance. He is not diaphoretic.  HENT:     Head: Normocephalic and atraumatic.  Cardiovascular:     Rate and Rhythm: Normal rate and regular rhythm.  Pulmonary:     Effort: Pulmonary effort is normal. No respiratory distress.  Genitourinary:    Comments: Erythema and 2 lesions of glans and just adjacent to it in skin fold Neurological:     Mental Status: He is alert.      No results found for any visits on 07/12/19.     Assessment & Plan   1. Candidal balanitis - new problem -  patient very low risk for STDs and discussed that appearance not consistent with syphilitic chancre  - appears to be candidal  - will treat with nystatin ointment BID until resolution - suspect pelvic soreness is reactive - discussed return precautions    Meds ordered this encounter  Medications  . DISCONTD: nystatin ointment (MYCOSTATIN)    Sig: Apply 1 application topically 2 (two) times daily.    Dispense:  30 g    Refill:  0  . nystatin ointment (MYCOSTATIN)    Sig: Apply 1 application topically 2 (two) times daily.    Dispense:  30 g    Refill:  0     Return if symptoms worsen or fail to improve.   The entirety of the information documented in the History of Present Illness, Review of Systems and Physical Exam were  personally obtained by me. Portions of this information were initially documented by Presley RaddleNikki Walston, CMA and reviewed by me for thoroughness and accuracy.    Bacigalupo, Marzella SchleinAngela M, MD MPH [facility]

## 2019-07-17 ENCOUNTER — Encounter: Payer: Self-pay | Admitting: Family Medicine

## 2019-07-17 DIAGNOSIS — Z79899 Other long term (current) drug therapy: Secondary | ICD-10-CM | POA: Diagnosis not present

## 2019-07-17 DIAGNOSIS — F1121 Opioid dependence, in remission: Secondary | ICD-10-CM | POA: Diagnosis not present

## 2019-07-17 MED ORDER — CYCLOBENZAPRINE HCL 10 MG PO TABS: 10.0000 mg | ORAL_TABLET | Freq: Three times a day (TID) | ORAL | 0 refills | Status: DC | PRN

## 2019-07-18 ENCOUNTER — Other Ambulatory Visit (HOSPITAL_COMMUNITY)
Admission: RE | Admit: 2019-07-18 | Discharge: 2019-07-18 | Disposition: A | Discharge status: Home / Self Care | Payer: BC Managed Care – PPO | Source: Ambulatory Visit | Attending: Physician Assistant | Admitting: Physician Assistant

## 2019-07-18 ENCOUNTER — Other Ambulatory Visit: Payer: Self-pay

## 2019-07-18 ENCOUNTER — Encounter: Payer: Self-pay | Admitting: Physician Assistant

## 2019-07-18 ENCOUNTER — Ambulatory Visit: Payer: BC Managed Care – PPO | Admitting: Physician Assistant

## 2019-07-18 VITALS — BP 156/82 | HR 90 | Temp 97.3°F | Wt 297.0 lb

## 2019-07-18 DIAGNOSIS — R300 Dysuria: Secondary | ICD-10-CM | POA: Diagnosis not present

## 2019-07-18 LAB — POCT URINALYSIS DIPSTICK
Bilirubin, UA: NEGATIVE
Glucose, UA: NEGATIVE
Ketones, UA: NEGATIVE
Leukocytes, UA: NEGATIVE
Nitrite, UA: NEGATIVE
Protein, UA: NEGATIVE
Spec Grav, UA: 1.025 (ref 1.010–1.025)
Urobilinogen, UA: 0.2 E.U./dL
pH, UA: 5 (ref 5.0–8.0)

## 2019-07-18 MED ORDER — CYCLOBENZAPRINE HCL 10 MG PO TABS: 10.0000 mg | ORAL_TABLET | Freq: Three times a day (TID) | ORAL | 0 refills | Status: DC | PRN

## 2019-07-18 NOTE — Addendum Note (Signed)
Addended by: Virginia Crews on: 07/18/2019 08:11 AM   Modules accepted: Orders

## 2019-07-18 NOTE — Progress Notes (Signed)
Patient: Ronald Mccoy Male    DOB: 31-Jul-1971   48 y.o.   MRN: [id number] Visit Date: 07/18/2019  Today's Provider: Trinna Post, PA-C   Chief Complaint  Patient presents with  . Urinary Tract Infection   Subjective:     Urinary Tract Infection  This is a new problem. The problem has been gradually worsening. There has been no fever. Associated symptoms include frequency, nausea and urgency. Pertinent negatives include no chills, discharge, flank pain, hematuria, hesitancy, sweats or vomiting. He has tried sitz baths for the symptoms.    Patient was treated for candidal rash in the genital area with nystatin ointment and has experienced relief with this. More recently, he has been having urinary frequency and some tingling when using the bathroom. Denies back pain, fevers, chills, N/V. Did have an episode of prostatitis two years ago that was treated with antibiotics - with that episode he had rectal pain and fever.     No Known Allergies   Current Outpatient Medications:  .  ALPRAZolam (XANAX) 0.5 MG tablet, Take 1 tablet (0.5 mg total) by mouth 2 (two) times daily as needed for anxiety., Disp: 15 tablet, Rfl: 0 .  Buprenorphine HCl-Naloxone HCl (SUBOXONE) 8-2 MG FILM, Place under the tongue. 1 1/2 films daily, Disp: , Rfl:  .  clobetasol (TEMOVATE) 0.05 % external solution, Apply 1 application topically 2 (two) times daily., Disp: 50 mL, Rfl: 0 .  clobetasol ointment (TEMOVATE) 1.47 %, Apply 1 application topically 2 (two) times daily., Disp: 30 g, Rfl: 2 .  cyclobenzaprine (FLEXERIL) 10 MG tablet, Take 1 tablet (10 mg total) by mouth 3 (three) times daily as needed for muscle spasms., Disp: 30 tablet, Rfl: 0 .  Multiple Vitamin (MULTIVITAMIN) tablet, Take 1 tablet by mouth daily., Disp: , Rfl:  .  nystatin ointment (MYCOSTATIN), Apply 1 application topically 2 (two) times daily., Disp: 30 g, Rfl: 0 .  Probiotic Product (PROBIOTIC ADVANCED PO), Take by mouth., Disp: , Rfl:    Review of Systems  Constitutional: Negative for chills and fatigue.  Respiratory: Negative.   Cardiovascular: Negative.   Gastrointestinal: Positive for abdominal pain and nausea. Negative for abdominal distention, anal bleeding, blood in stool, constipation, diarrhea, rectal pain and vomiting.  Genitourinary: Positive for dysuria, frequency and urgency. Negative for decreased urine volume, difficulty urinating, discharge, flank pain, hematuria, hesitancy, penile pain, penile swelling, scrotal swelling and testicular pain.  Musculoskeletal: Positive for back pain.  Neurological: Negative for dizziness, light-headedness and headaches.    Social History   Tobacco Use  . Smoking status: Former Smoker    Types: Cigarettes  . Smokeless tobacco: Never Used  . Tobacco comment: was social smoker  Substance Use Topics  . Alcohol use: No      Objective:   BP (!) 156/82 (BP Location: Left Arm, Patient Position: Sitting, Cuff Size: Large)   Pulse 90   Temp (!) 97.3 F (36.3 C) (Temporal)   Wt 297 lb (134.7 kg)   BMI 36.63 kg/m  Vitals:   07/18/19 0948  BP: (!) 156/82  Pulse: 90  Temp: (!) 97.3 F (36.3 C)  TempSrc: Temporal  Weight: 297 lb (134.7 kg)  Body mass index is 36.63 kg/m.   Physical Exam Constitutional:      Appearance: Normal appearance.  Cardiovascular:     Rate and Rhythm: Normal rate and regular rhythm.     Heart sounds: Normal heart sounds.  Pulmonary:  Effort: Pulmonary effort is normal.     Breath sounds: Normal breath sounds.  Abdominal:     General: Bowel sounds are normal.     Palpations: Abdomen is soft.  Skin:    General: Skin is warm and dry.  Neurological:     Mental Status: He is alert and oriented to person, place, and time. Mental status is at baseline.  Psychiatric:        Mood and Affect: Mood normal.        Behavior: Behavior normal.      Results for orders placed or performed in visit on 07/18/19  POCT urinalysis dipstick   Result Value Ref Range   Color, UA     Clarity, UA     Glucose, UA Negative Negative   Bilirubin, UA Negative    Ketones, UA Negative    Spec Grav, UA 1.025 1.010 - 1.025   Blood, UA Small    pH, UA 5.0 5.0 - 8.0   Protein, UA Negative Negative   Urobilinogen, UA 0.2 0.2 or 1.0 E.U./dL   Nitrite, UA Negative    Leukocytes, UA Negative Negative   Appearance     Odor         Assessment & Plan    1. Dysuria  Some blood on dipstick, will send for below testing. I think he is safe to wait for testing before starting antibiotics. Do not suspect he has prostatitis. Will send results through MyChart.   - CULTURE, URINE COMPREHENSIVE - Urine cytology ancillary only - Urinalysis, microscopic only - POCT urinalysis dipstick  The entirety of the information documented in the History of Present Illness, Review of Systems and Physical Exam were personally obtained by me. Portions of this information were initially documented by Kavin LeechLaura Walsh, CMA and reviewed by me for thoroughness and accuracy.   F/u PRN     Trey SailorsAdriana M Pollak, PA-C  [facility]

## 2019-07-18 NOTE — Patient Instructions (Signed)

## 2019-07-19 LAB — URINALYSIS, MICROSCOPIC ONLY
Casts: NONE SEEN /lpf
Epithelial Cells (non renal): NONE SEEN /hpf (ref 0–10)

## 2019-07-19 LAB — URINE CYTOLOGY ANCILLARY ONLY
Chlamydia: NEGATIVE
Neisseria Gonorrhea: NEGATIVE
Trichomonas: NEGATIVE

## 2019-07-23 ENCOUNTER — Encounter: Payer: Self-pay | Admitting: Physician Assistant

## 2019-07-23 LAB — CULTURE, URINE COMPREHENSIVE

## 2019-07-24 ENCOUNTER — Encounter: Payer: Self-pay | Admitting: Physician Assistant

## 2019-07-24 ENCOUNTER — Telehealth: Payer: Self-pay | Admitting: Physician Assistant

## 2019-07-24 NOTE — Telephone Encounter (Signed)
Advised patient to stop by to give repeat urine sample for UA microscopic and urine culture. It appears the "specimen could not be located" according to the [facility] result. May use hematuria for dx.

## 2019-07-25 DIAGNOSIS — L409 Psoriasis, unspecified: Secondary | ICD-10-CM | POA: Diagnosis not present

## 2019-07-25 DIAGNOSIS — B029 Zoster without complications: Secondary | ICD-10-CM | POA: Diagnosis not present

## 2019-07-26 NOTE — Telephone Encounter (Signed)
Patient reports that he went to urgent care last night and was diagnosed with shingles. Patient reports that his yeast has cleared up. Patient reports he will call later on to come in have his urine rechecked.

## 2019-08-07 DIAGNOSIS — Z79899 Other long term (current) drug therapy: Secondary | ICD-10-CM | POA: Diagnosis not present

## 2019-08-07 DIAGNOSIS — F1121 Opioid dependence, in remission: Secondary | ICD-10-CM | POA: Diagnosis not present

## 2019-11-14 ENCOUNTER — Other Ambulatory Visit: Payer: Self-pay | Admitting: Family Medicine

## 2019-11-18 MED ORDER — NYSTATIN 100000 UNIT/GM EX OINT: 1.0000 "application " | TOPICAL_OINTMENT | Freq: Two times a day (BID) | CUTANEOUS | 2 refills | Status: DC

## 2019-12-18 ENCOUNTER — Encounter: Payer: Self-pay | Admitting: Family Medicine

## 2019-12-18 DIAGNOSIS — L409 Psoriasis, unspecified: Secondary | ICD-10-CM

## 2019-12-19 MED ORDER — CLOBETASOL PROPIONATE 0.05 % EX OINT: 1.0000 "application " | TOPICAL_OINTMENT | Freq: Two times a day (BID) | CUTANEOUS | 2 refills | Status: DC

## 2019-12-31 ENCOUNTER — Encounter: Payer: Self-pay | Admitting: Family Medicine

## 2019-12-31 ENCOUNTER — Ambulatory Visit (INDEPENDENT_AMBULATORY_CARE_PROVIDER_SITE_OTHER): Payer: BC Managed Care – PPO | Admitting: Family Medicine

## 2019-12-31 ENCOUNTER — Other Ambulatory Visit: Payer: Self-pay

## 2019-12-31 VITALS — BP 146/78 | HR 87 | Temp 97.5°F | Ht 75.0 in | Wt 301.0 lb

## 2019-12-31 DIAGNOSIS — Z0000 Encounter for general adult medical examination without abnormal findings: Secondary | ICD-10-CM

## 2019-12-31 DIAGNOSIS — E669 Obesity, unspecified: Secondary | ICD-10-CM

## 2019-12-31 DIAGNOSIS — Z86006 Personal history of melanoma in-situ: Secondary | ICD-10-CM

## 2019-12-31 DIAGNOSIS — F1120 Opioid dependence, uncomplicated: Secondary | ICD-10-CM

## 2019-12-31 DIAGNOSIS — Z6837 Body mass index (BMI) 37.0-37.9, adult: Secondary | ICD-10-CM | POA: Diagnosis not present

## 2019-12-31 DIAGNOSIS — L409 Psoriasis, unspecified: Secondary | ICD-10-CM

## 2019-12-31 DIAGNOSIS — Z8371 Family history of colonic polyps: Secondary | ICD-10-CM | POA: Diagnosis not present

## 2019-12-31 DIAGNOSIS — Z83719 Family history of colon polyps, unspecified: Secondary | ICD-10-CM

## 2019-12-31 DIAGNOSIS — R030 Elevated blood-pressure reading, without diagnosis of hypertension: Secondary | ICD-10-CM

## 2019-12-31 DIAGNOSIS — D049 Carcinoma in situ of skin, unspecified: Secondary | ICD-10-CM

## 2019-12-31 DIAGNOSIS — F419 Anxiety disorder, unspecified: Secondary | ICD-10-CM

## 2019-12-31 DIAGNOSIS — Z1211 Encounter for screening for malignant neoplasm of colon: Secondary | ICD-10-CM

## 2019-12-31 DIAGNOSIS — E66812 Obesity, class 2: Secondary | ICD-10-CM

## 2019-12-31 MED ORDER — DESONIDE 0.05 % EX CREA: TOPICAL_CREAM | Freq: Two times a day (BID) | CUTANEOUS | 3 refills | Status: DC

## 2019-12-31 NOTE — Progress Notes (Signed)
Patient: Ronald Mccoy, Male    DOB: 02/09/1971, 49 y.o.   MRN: [id number] Visit Date: 01/01/2020  Today's Provider: Lavon Paganini, MD   Chief Complaint  Patient presents with  . Annual Exam   Subjective:     Annual physical exam Ronald Mccoy is a 49 y.o. male who presents today for health maintenance and complete physical. He feels well. He reports exercising some. He reports he is sleeping well.  ----------------------------------------------------------------- Had episodes of a vibration in his chest a few weeks ago  Better since stopping caffeine.  Had Holter monitor previously without any arrhythmia   Review of Systems  Constitutional: Negative.   HENT: Negative.   Eyes: Negative.   Respiratory: Negative.   Cardiovascular: Negative.   Gastrointestinal: Negative.   Endocrine: Negative.   Genitourinary: Negative.   Musculoskeletal: Negative.   Skin: Negative.   Allergic/Immunologic: Negative.   Neurological: Negative.   Hematological: Negative.   Psychiatric/Behavioral: Negative.     Social History      He  reports that he has quit smoking. His smoking use included cigarettes. He has never used smokeless tobacco. He reports that he does not drink alcohol or use drugs.       Social History   Socioeconomic History  . Marital status: Married    Spouse name: April  . Number of children: 2  . Years of education: 23  . Highest education level: Not on file  Occupational History    Employer: [employer]  Tobacco Use  . Smoking status: Former Smoker    Types: Cigarettes  . Smokeless tobacco: Never Used  . Tobacco comment: was social smoker  Substance and Sexual Activity  . Alcohol use: No  . Drug use: No  . Sexual activity: Not on file  Other Topics Concern  . Not on file  Social History Narrative  . Not on file   Social Determinants of Health   Financial Resource Strain:   . Difficulty of Paying Living Expenses: Not on file  Food Insecurity:    . Worried About Charity fundraiser in the Last Year: Not on file  . Ran Out of Food in the Last Year: Not on file  Transportation Needs:   . Lack of Transportation (Medical): Not on file  . Lack of Transportation (Non-Medical): Not on file  Physical Activity:   . Days of Exercise per Week: Not on file  . Minutes of Exercise per Session: Not on file  Stress:   . Feeling of Stress : Not on file  Social Connections:   . Frequency of Communication with Friends and Family: Not on file  . Frequency of Social Gatherings with Friends and Family: Not on file  . Attends Religious Services: Not on file  . Active Member of Clubs or Organizations: Not on file  . Attends Archivist Meetings: Not on file  . Marital Status: Not on file    Past Medical History:  Diagnosis Date  . Psoriasis 2012  . Shingles 2017     Patient Active Problem List   Diagnosis Date Noted  . Family history of colonic polyps 12/31/2019  . Narcotic addiction ([place]) 12/28/2018  . Eczema 12/28/2018  . H/O melanoma in situ 12/28/2018  . 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 Anxiety 07/10/2017  . Psoriasis 07/10/2017  . Obesity 07/10/2017  . Elevated BP without diagnosis of hypertension 07/10/2017    Past Surgical History:  Procedure Laterality Date  . TONSILLECTOMY  1978    Family History        Family Status  Relation Name Status  . Mother  Alive  . Father  Deceased  . Brother  Alive  . Brother  Alive  . Brother  Alive        His family history includes Breast cancer in his mother; COPD in his father; Cirrhosis in his father; Colon polyps in his father; Healthy in his brother, brother, and brother; Heart attack (age of onset: 77) in his father.      No Known Allergies   Current Outpatient Medications:  .  Buprenorphine HCl-Naloxone HCl (SUBOXONE) 8-2 MG FILM, Place under the tongue. 1 1/2 films daily, Disp: , Rfl:  .  clobetasol (TEMOVATE) 0.05 % external solution,  Apply 1 application topically 2 (two) times daily., Disp: 50 mL, Rfl: 0 .  clobetasol ointment (TEMOVATE) 0.05 %, Apply 1 application topically 2 (two) times daily., Disp: 60 g, Rfl: 2 .  Multiple Vitamin (MULTIVITAMIN) tablet, Take 1 tablet by mouth daily., Disp: , Rfl:  .  nystatin ointment (MYCOSTATIN), Apply 1 application topically 2 (two) times daily., Disp: 30 g, Rfl: 2 .  Probiotic Product (PROBIOTIC ADVANCED PO), Take by mouth., Disp: , Rfl:  .  desonide (DESOWEN) 0.05 % cream, Apply topically 2 (two) times daily., Disp: 30 g, Rfl: 3   Patient Care Team: Erasmo Downer, MD as PCP - General (Family Medicine)    Objective:    Vitals: BP (!) 146/78 (BP Location: Right Arm, Patient Position: Sitting, Cuff Size: Large)   Pulse 87   Temp (!) 97.5 F (36.4 C) (Temporal)   Ht 6\' 3"  (1.905 m)   Wt (!) 301 lb (136.5 kg)   BMI 37.62 kg/m    Vitals:   12/31/19 1000 12/31/19 1040  BP: (!) 152/99 (!) 146/78  Pulse: 87   Temp: (!) 97.5 F (36.4 C)   TempSrc: Temporal   Weight: (!) 301 lb (136.5 kg)   Height: 6\' 3"  (1.905 m)      Physical Exam Vitals reviewed.  Constitutional:      General: He is not in acute distress.    Appearance: Normal appearance. He is well-developed. He is obese. He is not diaphoretic.  HENT:     Head: Normocephalic and atraumatic.     Right Ear: Tympanic membrane, ear canal and external ear normal.     Left Ear: Tympanic membrane, ear canal and external ear normal.  Eyes:     General: No scleral icterus.    Conjunctiva/sclera: Conjunctivae normal.     Pupils: Pupils are equal, round, and reactive to light.  Neck:     Thyroid: No thyromegaly.  Cardiovascular:     Rate and Rhythm: Normal rate and regular rhythm.     Pulses: Normal pulses.     Heart sounds: Normal heart sounds. No murmur.  Pulmonary:     Effort: Pulmonary effort is normal. No respiratory distress.     Breath sounds: Normal breath sounds. No wheezing or rales.  Abdominal:      General: There is no distension.     Palpations: Abdomen is soft.     Tenderness: There is no abdominal tenderness. There is no guarding or rebound.  Musculoskeletal:        General: No deformity.     Cervical back: Neck supple.     Right lower leg: No edema.     Left lower leg:  No edema.  Lymphadenopathy:     Cervical: No cervical adenopathy.  Skin:    General: Skin is warm and dry.     Findings: Rash (psoriasis) present.  Neurological:     Mental Status: He is alert and oriented to person, place, and time. Mental status is at baseline.  Psychiatric:        Mood and Affect: Affect normal. Mood is anxious.        Speech: Speech normal.        Behavior: Behavior normal.        Thought Content: Thought content normal. Thought content does not include homicidal or suicidal ideation.      Depression Screen PHQ 2/9 Scores 12/31/2019 12/28/2018 07/10/2017  PHQ - 2 Score 0 0 0  PHQ- 9 Score 0 0 -       Assessment & Plan:     Routine Health Maintenance and Physical Exam  Exercise Activities and Dietary recommendations Goals   None     Immunization History  Administered Date(s) Administered  . Influenza,inj,Quad PF,6+ Mos 07/17/2019    Health Maintenance  Topic Date Due  . TETANUS/TDAP  05/05/1991  . INFLUENZA VACCINE  Completed  . HIV Screening  Completed     Discussed health benefits of physical activity, and encouraged him to engage in regular exercise appropriate for his age and condition.    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    Followed by Derm for regular skin checks        Other   Anxiety    Previously heightened in the setting of coming off  of Suboxone Has tried Effexor and Zoloft in the past and did not like side effects Declines SSRI or SNRI therapy at this time Avoid benzos given history of addiction Encouraged therapy - think that CBT would be helpful      Obesity    Discussed importance of healthy weight management Discussed diet and exercise       Relevant Orders   Comprehensive metabolic panel (Completed)   Lipid panel (Completed)   CBC (Completed)   Hemoglobin A1c (Completed)   TSH (Completed)   Elevated BP without diagnosis of hypertension    Believe this is whitecoat hypertension He does improve somewhat before the end of the visit Continue to monitor He is monitoring his home blood pressure, which has been normal Consider ambulatory blood pressure monitoring      Narcotic addiction (HCC)    Followed by addiction specialist On Suboxone Avoid other addictive medications      Family history of colonic polyps    Refer to GI to discuss whether he would be covered for screening colonoscopy      Relevant Orders   Ambulatory referral to Gastroenterology    Other Visit Diagnoses    Encounter for annual physical exam    -  Primary   Relevant Orders   Comprehensive metabolic panel (Completed)   Lipid panel (Completed)   CBC (Completed)   Hemoglobin A1c (Completed)   TSH (Completed)   Screen for colon cancer       Relevant Orders   Ambulatory referral to Gastroenterology  Return in about 1 year (around 12/30/2020) for CPE.   The entirety of the information documented in the History of Present Illness, Review of Systems and Physical Exam were personally obtained by me. Portions of this information were initially documented by Kavin Leech, CMA and reviewed by me for thoroughness and accuracy.    Zarahi Fuerst, Marzella Schlein, MD MPH [facility]

## 2019-12-31 NOTE — Assessment & Plan Note (Signed)
Followed by Derm for regular skin checks 

## 2019-12-31 NOTE — Patient Instructions (Signed)
Preventive Care 41-48 Years Old, Male Preventive care refers to lifestyle choices and visits with your health care provider that can promote health and wellness. This includes:  A yearly physical exam. This is also called an annual well check.  Regular dental and eye exams.  Immunizations.  Screening for certain conditions.  Healthy lifestyle choices, such as eating a healthy diet, getting regular exercise, not using drugs or products that contain nicotine and tobacco, and limiting alcohol use. What can I expect for my preventive care visit? Physical exam Your health care provider will check:  Height and weight. These may be used to calculate body mass index (BMI), which is a measurement that tells if you are at a healthy weight.  Heart rate and blood pressure.  Your skin for abnormal spots. Counseling Your health care provider may ask you questions about:  Alcohol, tobacco, and drug use.  Emotional well-being.  Home and relationship well-being.  Sexual activity.  Eating habits.  Work and work Statistician. What immunizations do I need?  Influenza (flu) vaccine  This is recommended every year. Tetanus, diphtheria, and pertussis (Tdap) vaccine  You may need a Td booster every 10 years. Varicella (chickenpox) vaccine  You may need this vaccine if you have not already been vaccinated. Zoster (shingles) vaccine  You may need this after age 64. Measles, mumps, and rubella (MMR) vaccine  You may need at least one dose of MMR if you were born in 1957 or later. You may also need a second dose. Pneumococcal conjugate (PCV13) vaccine  You may need this if you have certain conditions and were not previously vaccinated. Pneumococcal polysaccharide (PPSV23) vaccine  You may need one or two doses if you smoke cigarettes or if you have certain conditions. Meningococcal conjugate (MenACWY) vaccine  You may need this if you have certain conditions. Hepatitis A  vaccine  You may need this if you have certain conditions or if you travel or work in places where you may be exposed to hepatitis A. Hepatitis B vaccine  You may need this if you have certain conditions or if you travel or work in places where you may be exposed to hepatitis B. Haemophilus influenzae type b (Hib) vaccine  You may need this if you have certain risk factors. Human papillomavirus (HPV) vaccine  If recommended by your health care provider, you may need three doses over 6 months. You may receive vaccines as individual doses or as more than one vaccine together in one shot (combination vaccines). Talk with your health care provider about the risks and benefits of combination vaccines. What tests do I need? Blood tests  Lipid and cholesterol levels. These may be checked every 5 years, or more frequently if you are over 60 years old.  Hepatitis C test.  Hepatitis B test. Screening  Lung cancer screening. You may have this screening every year starting at age 43 if you have a 30-pack-year history of smoking and currently smoke or have quit within the past 15 years.  Prostate cancer screening. Recommendations will vary depending on your family history and other risks.  Colorectal cancer screening. All adults should have this screening starting at age 72 and continuing until age 2. Your health care provider may recommend screening at age 14 if you are at increased risk. You will have tests every 1-10 years, depending on your results and the type of screening test.  Diabetes screening. This is done by checking your blood sugar (glucose) after you have not eaten  for a while (fasting). You may have this done every 1-3 years.  Sexually transmitted disease (STD) testing. Follow these instructions at home: Eating and drinking  Eat a diet that includes fresh fruits and vegetables, whole grains, lean protein, and low-fat dairy products.  Take vitamin and mineral supplements as  recommended by your health care provider.  Do not drink alcohol if your health care provider tells you not to drink.  If you drink alcohol: ? Limit how much you have to 0-2 drinks a day. ? Be aware of how much alcohol is in your drink. In the [place] one drink equals one 12 oz bottle of beer (355 mL), one 5 oz glass of wine (148 mL), or one 1 oz glass of hard liquor (44 mL). Lifestyle  Take daily care of your teeth and gums.  Stay active. Exercise for at least 30 minutes on 5 or more days each week.  Do not use any products that contain nicotine or tobacco, such as cigarettes, e-cigarettes, and chewing tobacco. If you need help quitting, ask your health care provider.  If you are sexually active, practice safe sex. Use a condom or other form of protection to prevent STIs (sexually transmitted infections).  Talk with your health care provider about taking a low-dose aspirin every day starting at age 53. What's next?  Go to your health care provider once a year for a well check visit.  Ask your health care provider how often you should have your eyes and teeth checked.  Stay up to date on all vaccines. This information is not intended to replace advice given to you by your health care provider. Make sure you discuss any questions you have with your health care provider. Document Revised: 10/25/2018 Document Reviewed: 10/25/2018 Elsevier Patient Education  2020 [employer].

## 2019-12-31 NOTE — Assessment & Plan Note (Signed)
Followed by Derm for regular skin checks

## 2020-01-01 ENCOUNTER — Telehealth: Payer: Self-pay

## 2020-01-01 LAB — CBC
Hematocrit: 45.2 % (ref 37.5–51.0)
Hemoglobin: 15.5 g/dL (ref 13.0–17.7)
MCH: 28.9 pg (ref 26.6–33.0)
MCHC: 34.3 g/dL (ref 31.5–35.7)
MCV: 84 fL (ref 79–97)
Platelets: 290 10*3/uL (ref 150–450)
RBC: 5.36 x10E6/uL (ref 4.14–5.80)
RDW: 12.5 % (ref 11.6–15.4)
WBC: 9.1 10*3/uL (ref 3.4–10.8)

## 2020-01-01 LAB — COMPREHENSIVE METABOLIC PANEL
ALT: 13 IU/L (ref 0–44)
AST: 21 IU/L (ref 0–40)
Albumin/Globulin Ratio: 1.1 — ABNORMAL LOW (ref 1.2–2.2)
Albumin: 4.3 g/dL (ref 4.0–5.0)
Alkaline Phosphatase: 89 IU/L (ref 39–117)
BUN/Creatinine Ratio: 11 (ref 9–20)
BUN: 9 mg/dL (ref 6–24)
Bilirubin Total: 0.5 mg/dL (ref 0.0–1.2)
CO2: 24 mmol/L (ref 20–29)
Calcium: 9.5 mg/dL (ref 8.7–10.2)
Chloride: 97 mmol/L (ref 96–106)
Creatinine, Ser: 0.81 mg/dL (ref 0.76–1.27)
GFR calc Af Amer: 122 mL/min/{1.73_m2} (ref >59)
GFR calc non Af Amer: 106 mL/min/{1.73_m2} (ref >59)
Globulin, Total: 3.9 g/dL (ref 1.5–4.5)
Glucose: 103 mg/dL — ABNORMAL HIGH (ref 65–99)
Potassium: 4.1 mmol/L (ref 3.5–5.2)
Sodium: 137 mmol/L (ref 134–144)
Total Protein: 8.2 g/dL (ref 6.0–8.5)

## 2020-01-01 LAB — LIPID PANEL
Chol/HDL Ratio: 3.3 ratio (ref 0.0–5.0)
Cholesterol, Total: 160 mg/dL (ref 100–199)
HDL: 49 mg/dL (ref >39)
LDL Chol Calc (NIH): 94 mg/dL (ref 0–99)
Triglycerides: 91 mg/dL (ref 0–149)
VLDL Cholesterol Cal: 17 mg/dL (ref 5–40)

## 2020-01-01 LAB — HEMOGLOBIN A1C
Est. average glucose Bld gHb Est-mCnc: 120 mg/dL
Hgb A1c MFr Bld: 5.8 % — ABNORMAL HIGH (ref 4.8–5.6)

## 2020-01-01 LAB — TSH: TSH: 1.57 u[IU]/mL (ref 0.450–4.500)

## 2020-01-01 NOTE — Assessment & Plan Note (Signed)
Continue topicals Continue to f/u with Derm No joint symptoms

## 2020-01-01 NOTE — Assessment & Plan Note (Signed)
Discussed importance of healthy weight management Discussed diet and exercise  

## 2020-01-01 NOTE — Assessment & Plan Note (Signed)
Refer to GI to discuss whether he would be covered for screening colonoscopy

## 2020-01-01 NOTE — Telephone Encounter (Signed)
-----   Message from Angela M Bacigalupo, MD sent at 01/01/2020  4:30 PM EST ----- Normal labs, except A1c remains in prediabetic range.  Recommend low carb diet and exercise. 

## 2020-01-01 NOTE — Telephone Encounter (Signed)
[employer]   Result Notes and Comments to Patient Comment seen by patient Ronald Mccoy on 01/01/2020 4:32 PM EST

## 2020-01-01 NOTE — Telephone Encounter (Signed)
-----   Message from Erasmo Downer, MD sent at 01/01/2020  4:30 PM EST ----- Normal labs, except A1c remains in prediabetic range.  Recommend low carb diet and exercise.

## 2020-01-01 NOTE — Assessment & Plan Note (Signed)
Previously heightened in the setting of coming off of Suboxone Has tried Effexor and Zoloft in the past and did not like side effects Declines SSRI or SNRI therapy at this time Avoid benzos given history of addiction Encouraged therapy - think that CBT would be helpful

## 2020-01-01 NOTE — Assessment & Plan Note (Signed)
Believe this is whitecoat hypertension He does improve somewhat before the end of the visit Continue to monitor He is monitoring his home blood pressure, which has been normal Consider ambulatory blood pressure monitoring

## 2020-01-01 NOTE — Assessment & Plan Note (Signed)
Followed by addiction specialist On Suboxone Avoid other addictive medications 

## 2020-01-17 DIAGNOSIS — R079 Chest pain, unspecified: Secondary | ICD-10-CM | POA: Diagnosis not present

## 2020-01-17 DIAGNOSIS — F419 Anxiety disorder, unspecified: Secondary | ICD-10-CM | POA: Diagnosis not present

## 2020-01-17 DIAGNOSIS — R0602 Shortness of breath: Secondary | ICD-10-CM | POA: Diagnosis not present

## 2020-01-17 DIAGNOSIS — R002 Palpitations: Secondary | ICD-10-CM | POA: Diagnosis not present

## 2020-01-27 ENCOUNTER — Encounter: Payer: Self-pay | Admitting: *Deleted

## 2020-03-12 DIAGNOSIS — R0602 Shortness of breath: Secondary | ICD-10-CM | POA: Diagnosis not present

## 2020-03-12 DIAGNOSIS — R079 Chest pain, unspecified: Secondary | ICD-10-CM | POA: Diagnosis not present

## 2020-03-13 DIAGNOSIS — R079 Chest pain, unspecified: Secondary | ICD-10-CM | POA: Diagnosis not present

## 2020-03-13 DIAGNOSIS — R0602 Shortness of breath: Secondary | ICD-10-CM | POA: Diagnosis not present

## 2020-04-01 DIAGNOSIS — F411 Generalized anxiety disorder: Secondary | ICD-10-CM | POA: Diagnosis not present

## 2020-04-01 DIAGNOSIS — F1121 Opioid dependence, in remission: Secondary | ICD-10-CM | POA: Diagnosis not present

## 2020-04-23 DIAGNOSIS — L400 Psoriasis vulgaris: Secondary | ICD-10-CM | POA: Diagnosis not present

## 2020-04-23 DIAGNOSIS — Z79899 Other long term (current) drug therapy: Secondary | ICD-10-CM | POA: Diagnosis not present

## 2020-04-23 DIAGNOSIS — Z85820 Personal history of malignant melanoma of skin: Secondary | ICD-10-CM | POA: Diagnosis not present

## 2020-04-23 DIAGNOSIS — L578 Other skin changes due to chronic exposure to nonionizing radiation: Secondary | ICD-10-CM | POA: Diagnosis not present

## 2020-04-29 DIAGNOSIS — F1121 Opioid dependence, in remission: Secondary | ICD-10-CM | POA: Diagnosis not present

## 2020-04-29 DIAGNOSIS — F411 Generalized anxiety disorder: Secondary | ICD-10-CM | POA: Diagnosis not present

## 2020-05-11 DIAGNOSIS — L400 Psoriasis vulgaris: Secondary | ICD-10-CM | POA: Diagnosis not present

## 2020-05-27 DIAGNOSIS — F411 Generalized anxiety disorder: Secondary | ICD-10-CM | POA: Diagnosis not present

## 2020-05-27 DIAGNOSIS — F1121 Opioid dependence, in remission: Secondary | ICD-10-CM | POA: Diagnosis not present

## 2020-06-24 DIAGNOSIS — F411 Generalized anxiety disorder: Secondary | ICD-10-CM | POA: Diagnosis not present

## 2020-06-24 DIAGNOSIS — F1121 Opioid dependence, in remission: Secondary | ICD-10-CM | POA: Diagnosis not present

## 2020-07-06 DIAGNOSIS — U071 COVID-19: Secondary | ICD-10-CM | POA: Diagnosis not present

## 2020-07-06 DIAGNOSIS — Z20822 Contact with and (suspected) exposure to covid-19: Secondary | ICD-10-CM | POA: Diagnosis not present

## 2020-07-12 ENCOUNTER — Encounter: Payer: Self-pay | Admitting: Family Medicine

## 2020-07-12 DIAGNOSIS — U071 COVID-19: Secondary | ICD-10-CM

## 2020-07-12 DIAGNOSIS — R0602 Shortness of breath: Secondary | ICD-10-CM

## 2020-07-14 ENCOUNTER — Ambulatory Visit
Admission: RE | Admit: 2020-07-14 | Discharge: 2020-07-14 | Disposition: A | Discharge status: Home / Self Care | Payer: BC Managed Care – PPO | Attending: Family Medicine | Admitting: Family Medicine

## 2020-07-14 ENCOUNTER — Other Ambulatory Visit: Payer: Self-pay

## 2020-07-14 ENCOUNTER — Telehealth: Payer: Self-pay

## 2020-07-14 ENCOUNTER — Ambulatory Visit
Admission: RE | Admit: 2020-07-14 | Discharge: 2020-07-14 | Disposition: A | Discharge status: Home / Self Care | Payer: BC Managed Care – PPO | Source: Ambulatory Visit | Attending: Family Medicine | Admitting: Family Medicine

## 2020-07-14 DIAGNOSIS — R05 Cough: Secondary | ICD-10-CM | POA: Insufficient documentation

## 2020-07-14 DIAGNOSIS — R0602 Shortness of breath: Secondary | ICD-10-CM | POA: Diagnosis not present

## 2020-07-14 DIAGNOSIS — U071 COVID-19: Secondary | ICD-10-CM | POA: Diagnosis not present

## 2020-07-14 DIAGNOSIS — J9811 Atelectasis: Secondary | ICD-10-CM | POA: Diagnosis not present

## 2020-07-14 MED ORDER — ALBUTEROL SULFATE HFA 108 (90 BASE) MCG/ACT IN AERS: 2.0000 | INHALATION_SPRAY | Freq: Four times a day (QID) | RESPIRATORY_TRACT | 0 refills | Status: DC | PRN

## 2020-07-14 NOTE — Telephone Encounter (Signed)
Advised pt the Chest Xray is ordered.

## 2020-07-14 NOTE — Telephone Encounter (Signed)
Please order CXR 2v at [facility] for COVID19 and shortness of breath and instruct patient how to go and have it done.  We will call/send message with results.

## 2020-07-14 NOTE — Addendum Note (Signed)
Addended by: Kavin Leech E on: 07/14/2020 03:01 PM   Modules accepted: Orders

## 2020-07-16 NOTE — Telephone Encounter (Signed)
Ok to send in another albuterol inhaler.  Let the patient know we have and that it does sound like he is turning the corner and SOB with exertion is normal post-COVID

## 2020-07-17 ENCOUNTER — Telehealth: Payer: Self-pay

## 2020-07-17 MED ORDER — ALBUTEROL SULFATE HFA 108 (90 BASE) MCG/ACT IN AERS: 2.0000 | INHALATION_SPRAY | Freq: Four times a day (QID) | RESPIRATORY_TRACT | 0 refills | Status: DC | PRN

## 2020-07-17 NOTE — Telephone Encounter (Signed)
rx sent

## 2020-07-21 ENCOUNTER — Encounter: Payer: Self-pay | Admitting: Family Medicine

## 2020-07-29 DIAGNOSIS — F1121 Opioid dependence, in remission: Secondary | ICD-10-CM | POA: Diagnosis not present

## 2020-07-29 DIAGNOSIS — F411 Generalized anxiety disorder: Secondary | ICD-10-CM | POA: Diagnosis not present

## 2020-08-15 ENCOUNTER — Encounter: Payer: Self-pay | Admitting: Family Medicine

## 2020-08-26 DIAGNOSIS — F1121 Opioid dependence, in remission: Secondary | ICD-10-CM | POA: Diagnosis not present

## 2020-08-26 DIAGNOSIS — F411 Generalized anxiety disorder: Secondary | ICD-10-CM | POA: Diagnosis not present

## 2020-08-31 DIAGNOSIS — F1121 Opioid dependence, in remission: Secondary | ICD-10-CM | POA: Diagnosis not present

## 2020-09-23 DIAGNOSIS — F411 Generalized anxiety disorder: Secondary | ICD-10-CM | POA: Diagnosis not present

## 2020-09-23 DIAGNOSIS — F1121 Opioid dependence, in remission: Secondary | ICD-10-CM | POA: Diagnosis not present

## 2020-10-02 ENCOUNTER — Encounter: Payer: Self-pay | Admitting: Family Medicine

## 2020-10-02 ENCOUNTER — Ambulatory Visit: Payer: BC Managed Care – PPO | Admitting: Family Medicine

## 2020-10-02 ENCOUNTER — Other Ambulatory Visit: Payer: Self-pay

## 2020-10-02 VITALS — BP 149/84 | HR 88 | Temp 98.3°F | Resp 16 | Ht 75.0 in | Wt 313.0 lb

## 2020-10-02 DIAGNOSIS — M79672 Pain in left foot: Secondary | ICD-10-CM | POA: Diagnosis not present

## 2020-10-02 DIAGNOSIS — F1120 Opioid dependence, uncomplicated: Secondary | ICD-10-CM

## 2020-10-02 NOTE — Patient Instructions (Signed)

## 2020-10-02 NOTE — Progress Notes (Signed)
I,April Miller,acting as a Neurosurgeon for [employer].,have documented all relevant documentation on the behalf of [employer],as directed by  [employer] while in the presence of [employer], [place].   Established patient visit   Patient: Ronald Mccoy   DOB: [birth year]   49 y.o. Male  MRN: [id number] Visit Date: 10/02/2020  Today's healthcare provider: Dortha Kern, PA   Chief Complaint  Patient presents with   Ankle Pain   Subjective    HPI  Patient states he has had left ankle pain for 3 days. No injury mechanism. Pain is located on inside area of left ankle. Left ankle is slightly swollen. Patient states he has been resting his foot and taking ibuprofen with no relief. Also states his foot is painful to walk on. Patient has a small rash on his lower left leg that he is concerned may be shingles.   Past Medical History:  Diagnosis Date   Psoriasis 2012   Shingles 2017   Past Surgical History:  Procedure Laterality Date   TONSILLECTOMY  1978   Social History   Tobacco Use   Smoking status: Former Smoker    Types: Cigarettes   Smokeless tobacco: Never Used   Tobacco comment: was social smoker  Building services engineer Use: Never used  Substance Use Topics   Alcohol use: No   Drug use: No   Family History  Problem Relation Age of Onset   Breast cancer Mother        in remission   Cirrhosis Father    COPD Father    Heart attack Father 3   Colon polyps Father        Late 42's   Healthy Brother    Healthy Brother    Healthy Brother    No Known Allergies     Medications: Outpatient Medications Prior to Visit  Medication Sig   albuterol (VENTOLIN HFA) 108 (90 Base) MCG/ACT inhaler Inhale 2 puffs into the lungs every 6 (six) hours as needed for wheezing or shortness of breath.   Buprenorphine HCl-Naloxone HCl (SUBOXONE) 8-2 MG FILM Place under the tongue. 1 1/2 films daily   clobetasol (TEMOVATE) 0.05 % external solution Apply 1  application topically 2 (two) times daily.   clobetasol ointment (TEMOVATE) 0.05 % Apply 1 application topically 2 (two) times daily.   desonide (DESOWEN) 0.05 % cream Apply topically 2 (two) times daily.   Multiple Vitamin (MULTIVITAMIN) tablet Take 1 tablet by mouth daily.   nystatin ointment (MYCOSTATIN) Apply 1 application topically 2 (two) times daily.   Probiotic Product (PROBIOTIC ADVANCED PO) Take by mouth.   No facility-administered medications prior to visit.    Review of Systems  Constitutional: Negative for appetite change, chills and fever.  Respiratory: Negative for chest tightness, shortness of breath and wheezing.   Cardiovascular: Negative for chest pain and palpitations.  Gastrointestinal: Negative for abdominal pain, nausea and vomiting.      Objective    BP (!) 149/84 (BP Location: Right Arm, Patient Position: Sitting, Cuff Size: Large)   Pulse 88   Temp 98.3 F (36.8 C) (Oral)   Resp 16   Ht 6\' 3"  (1.905 m)   Wt (!) 313 lb (142 kg)   SpO2 98%   BMI 39.12 kg/m  BP Readings from Last 3 Encounters:  10/02/20 (!) 149/84  12/31/19 (!) 146/78  07/18/19 (!) 156/82   Wt Readings from Last 3 Encounters:  10/02/20 (!) 313 lb (  142 kg)  12/31/19 (!) 301 lb (136.5 kg)  07/18/19 297 lb (134.7 kg)     Physical Exam Constitutional:      General: He is not in acute distress.    Appearance: He is well-developed.  HENT:     Head: Normocephalic and atraumatic.     Right Ear: Hearing and tympanic membrane normal.     Left Ear: Hearing and tympanic membrane normal.     Nose: Nose normal.  Eyes:     General: Lids are normal. No scleral icterus.       Right eye: No discharge.        Left eye: No discharge.     Conjunctiva/sclera: Conjunctivae normal.  Cardiovascular:     Rate and Rhythm: Normal rate and regular rhythm.     Heart sounds: Normal heart sounds.  Pulmonary:     Effort: Pulmonary effort is normal. No respiratory distress.     Breath sounds: Normal  breath sounds.  Abdominal:     General: Bowel sounds are normal.     Palpations: Abdomen is soft.  Musculoskeletal:        General: Normal range of motion.     Cervical back: Neck supple.  Skin:    Findings: No lesion or rash.  Neurological:     Mental Status: He is alert and oriented to person, place, and time.  Psychiatric:        Speech: Speech normal.        Behavior: Behavior normal.        Thought Content: Thought content normal.       No results found for any visits on 10/02/20.  Assessment & Plan     1. Pain in left foot Onset 2 days ago without history of trauma. Remembers being on a protein rich diet the past 3 days. Took 800 mg Ibuprofen and ankle feeling much better now. Counseled regarding possible gout and psoriatic arthritis but declines lab tests. He will monitor for recurrences. Given Low Purine diet.  2. Narcotic addiction (HCC) Presently on Suboxone regimen and followed by addition specialist..    No follow-ups on file.      Haywood Pao, PA, have reviewed all documentation for this visit. The documentation on 10/02/20 for the exam, diagnosis, procedures, and orders are all accurate and complete.    Dortha Kern, PA  [facility] [phone] (phone) [phone] (fax)  [facility]

## 2020-10-21 DIAGNOSIS — F1121 Opioid dependence, in remission: Secondary | ICD-10-CM | POA: Diagnosis not present

## 2020-10-21 DIAGNOSIS — F411 Generalized anxiety disorder: Secondary | ICD-10-CM | POA: Diagnosis not present

## 2020-10-21 DIAGNOSIS — Z7151 Drug abuse counseling and surveillance of drug abuser: Secondary | ICD-10-CM | POA: Diagnosis not present

## 2020-10-26 DIAGNOSIS — L570 Actinic keratosis: Secondary | ICD-10-CM | POA: Diagnosis not present

## 2020-10-26 DIAGNOSIS — D485 Neoplasm of uncertain behavior of skin: Secondary | ICD-10-CM | POA: Diagnosis not present

## 2020-10-26 DIAGNOSIS — Z79899 Other long term (current) drug therapy: Secondary | ICD-10-CM | POA: Diagnosis not present

## 2020-10-26 DIAGNOSIS — L400 Psoriasis vulgaris: Secondary | ICD-10-CM | POA: Diagnosis not present

## 2020-10-26 DIAGNOSIS — Z85828 Personal history of other malignant neoplasm of skin: Secondary | ICD-10-CM | POA: Diagnosis not present

## 2020-10-26 DIAGNOSIS — Z85820 Personal history of malignant melanoma of skin: Secondary | ICD-10-CM | POA: Diagnosis not present

## 2020-11-11 DIAGNOSIS — L400 Psoriasis vulgaris: Secondary | ICD-10-CM | POA: Diagnosis not present

## 2020-11-11 DIAGNOSIS — E559 Vitamin D deficiency, unspecified: Secondary | ICD-10-CM | POA: Diagnosis not present

## 2020-11-25 DIAGNOSIS — F1121 Opioid dependence, in remission: Secondary | ICD-10-CM | POA: Diagnosis not present

## 2020-11-25 DIAGNOSIS — F411 Generalized anxiety disorder: Secondary | ICD-10-CM | POA: Diagnosis not present

## 2020-12-23 DIAGNOSIS — F411 Generalized anxiety disorder: Secondary | ICD-10-CM | POA: Diagnosis not present

## 2020-12-23 DIAGNOSIS — F1121 Opioid dependence, in remission: Secondary | ICD-10-CM | POA: Diagnosis not present

## 2020-12-23 DIAGNOSIS — Z7151 Drug abuse counseling and surveillance of drug abuser: Secondary | ICD-10-CM | POA: Diagnosis not present

## 2020-12-31 ENCOUNTER — Encounter: Payer: BC Managed Care – PPO | Admitting: Family Medicine

## 2020-12-31 DIAGNOSIS — D235 Other benign neoplasm of skin of trunk: Secondary | ICD-10-CM | POA: Diagnosis not present

## 2020-12-31 NOTE — Progress Notes (Deleted)
Complete physical exam   Patient: Ronald Mccoy   DOB: [birth year]   50 y.o. Male  MRN: [id number] Visit Date: 12/31/2020  Today's healthcare provider: Shirlee Latch, MD   No chief complaint on file.  Subjective    Ronald Mccoy is a 50 y.o. male who presents today for a complete physical exam.  He reports consuming a {diet types:17450} diet. {Exercise:19826} He generally feels {well/fairly well/poorly:18703}. He reports sleeping {well/fairly well/poorly:18703}. He {does/does not:200015} have additional problems to discuss today.  HPI  ***  Past Medical History:  Diagnosis Date  . Psoriasis 2012  . Shingles 2017   Past Surgical History:  Procedure Laterality Date  . TONSILLECTOMY  1978   Social History   Socioeconomic History  . Marital status: Married    Spouse name: April  . Number of children: 2  . Years of education: 23  . Highest education level: Not on file  Occupational History    Employer: [employer]  Tobacco Use  . Smoking status: Former Smoker    Types: Cigarettes  . Smokeless tobacco: Never Used  . Tobacco comment: was social smoker  Vaping Use  . Vaping Use: Never used  Substance and Sexual Activity  . Alcohol use: No  . Drug use: No  . Sexual activity: Not on file  Other Topics Concern  . Not on file  Social History Narrative  . Not on file   Social Determinants of Health   Financial Resource Strain: Not on file  Food Insecurity: Not on file  Transportation Needs: Not on file  Physical Activity: Not on file  Stress: Not on file  Social Connections: Not on file  Intimate Partner Violence: Not on file   Family Status  Relation Name Status  . Mother  Alive  . Father  Deceased  . Brother  Alive  . Brother  Alive  . Brother  Alive   Family History  Problem Relation Age of Onset  . Breast cancer Mother        in remission  . Cirrhosis Father   . COPD Father   . Heart attack Father 43  . Colon polyps Father        Late  49's  . Healthy Brother   . Healthy Brother   . Healthy Brother    No Known Allergies  Patient Care Team: Erasmo Downer, MD as PCP - General (Family Medicine)   Medications: Outpatient Medications Prior to Visit  Medication Sig  . albuterol (VENTOLIN HFA) 108 (90 Base) MCG/ACT inhaler Inhale 2 puffs into the lungs every 6 (six) hours as needed for wheezing or shortness of breath.  . Buprenorphine HCl-Naloxone HCl (SUBOXONE) 8-2 MG FILM Place under the tongue. 1 1/2 films daily  . clobetasol (TEMOVATE) 0.05 % external solution Apply 1 application topically 2 (two) times daily.  . clobetasol ointment (TEMOVATE) 0.05 % Apply 1 application topically 2 (two) times daily.  Marland Kitchen desonide (DESOWEN) 0.05 % cream Apply topically 2 (two) times daily.  . Multiple Vitamin (MULTIVITAMIN) tablet Take 1 tablet by mouth daily.  Marland Kitchen nystatin ointment (MYCOSTATIN) Apply 1 application topically 2 (two) times daily.  . Probiotic Product (PROBIOTIC ADVANCED PO) Take by mouth.   No facility-administered medications prior to visit.    Review of Systems  {Labs  Heme  Chem  Endocrine  Serology  Results Review (optional):23779::" "}  Objective    There were no vitals taken for this visit. {Show previous vital  signs (optional):23777::" "}  Physical Exam  ***  Last depression screening scores PHQ 2/9 Scores 12/31/2019 12/28/2018 07/10/2017  PHQ - 2 Score 0 0 0  PHQ- 9 Score 0 0 -   Last fall risk screening Fall Risk  12/31/2019  Falls in the past year? 0  Number falls in past yr: 0  Injury with Fall? 0  Follow up Falls evaluation completed   Last Audit-C alcohol use screening Alcohol Use Disorder Test (AUDIT) 12/31/2019  1. How often do you have a drink containing alcohol? 0  2. How many drinks containing alcohol do you have on a typical day when you are drinking? 0  3. How often do you have six or more drinks on one occasion? 0  AUDIT-C Score 0   A score of 3 or more in women, and 4 or  more in men indicates increased risk for alcohol abuse, EXCEPT if all of the points are from question 1   No results found for any visits on 12/31/20.  Assessment & Plan    Routine Health Maintenance and Physical Exam  Exercise Activities and Dietary recommendations Goals   None     Immunization History  Administered Date(s) Administered  . Influenza,inj,Quad PF,6+ Mos 07/17/2019    Health Maintenance  Topic Date Due  . Hepatitis C Screening  Never done  . COVID-19 Vaccine (1) Never done  . TETANUS/TDAP  Never done  . COLONOSCOPY (Pts 45-1yrs Insurance coverage will need to be confirmed)  Never done  . INFLUENZA VACCINE  06/14/2020  . HIV Screening  Completed    Discussed health benefits of physical activity, and encouraged him to engage in regular exercise appropriate for his age and condition.  ***  No follow-ups on file.     {provider attestation***:1}   Shirlee Latch, MD  [facility] [phone] (phone) [phone] (fax)  [facility]

## 2021-01-21 DIAGNOSIS — F411 Generalized anxiety disorder: Secondary | ICD-10-CM | POA: Diagnosis not present

## 2021-01-21 DIAGNOSIS — F1121 Opioid dependence, in remission: Secondary | ICD-10-CM | POA: Diagnosis not present

## 2021-02-17 DIAGNOSIS — F1120 Opioid dependence, uncomplicated: Secondary | ICD-10-CM | POA: Diagnosis not present

## 2021-02-17 DIAGNOSIS — F411 Generalized anxiety disorder: Secondary | ICD-10-CM | POA: Diagnosis not present

## 2021-02-17 DIAGNOSIS — F1121 Opioid dependence, in remission: Secondary | ICD-10-CM | POA: Diagnosis not present

## 2021-02-17 DIAGNOSIS — Z7151 Drug abuse counseling and surveillance of drug abuser: Secondary | ICD-10-CM | POA: Diagnosis not present

## 2021-03-17 DIAGNOSIS — F1121 Opioid dependence, in remission: Secondary | ICD-10-CM | POA: Diagnosis not present

## 2021-03-17 DIAGNOSIS — F411 Generalized anxiety disorder: Secondary | ICD-10-CM | POA: Diagnosis not present

## 2021-04-21 DIAGNOSIS — F1120 Opioid dependence, uncomplicated: Secondary | ICD-10-CM | POA: Diagnosis not present

## 2021-04-21 DIAGNOSIS — F411 Generalized anxiety disorder: Secondary | ICD-10-CM | POA: Diagnosis not present

## 2021-04-21 DIAGNOSIS — F1121 Opioid dependence, in remission: Secondary | ICD-10-CM | POA: Diagnosis not present

## 2021-04-21 DIAGNOSIS — Z7151 Drug abuse counseling and surveillance of drug abuser: Secondary | ICD-10-CM | POA: Diagnosis not present

## 2021-04-29 DIAGNOSIS — D485 Neoplasm of uncertain behavior of skin: Secondary | ICD-10-CM | POA: Diagnosis not present

## 2021-04-29 DIAGNOSIS — Z79899 Other long term (current) drug therapy: Secondary | ICD-10-CM | POA: Diagnosis not present

## 2021-04-29 DIAGNOSIS — L400 Psoriasis vulgaris: Secondary | ICD-10-CM | POA: Diagnosis not present

## 2021-04-29 DIAGNOSIS — Z7689 Persons encountering health services in other specified circumstances: Secondary | ICD-10-CM | POA: Diagnosis not present

## 2021-04-29 DIAGNOSIS — L578 Other skin changes due to chronic exposure to nonionizing radiation: Secondary | ICD-10-CM | POA: Diagnosis not present

## 2021-05-03 DIAGNOSIS — Z7689 Persons encountering health services in other specified circumstances: Secondary | ICD-10-CM | POA: Diagnosis not present

## 2021-05-19 DIAGNOSIS — F1121 Opioid dependence, in remission: Secondary | ICD-10-CM | POA: Diagnosis not present

## 2021-05-19 DIAGNOSIS — F411 Generalized anxiety disorder: Secondary | ICD-10-CM | POA: Diagnosis not present

## 2021-06-03 DIAGNOSIS — D235 Other benign neoplasm of skin of trunk: Secondary | ICD-10-CM | POA: Diagnosis not present

## 2021-06-23 DIAGNOSIS — F411 Generalized anxiety disorder: Secondary | ICD-10-CM | POA: Diagnosis not present

## 2021-06-23 DIAGNOSIS — Z7151 Drug abuse counseling and surveillance of drug abuser: Secondary | ICD-10-CM | POA: Diagnosis not present

## 2021-06-23 DIAGNOSIS — F1120 Opioid dependence, uncomplicated: Secondary | ICD-10-CM | POA: Diagnosis not present

## 2021-06-23 DIAGNOSIS — F1121 Opioid dependence, in remission: Secondary | ICD-10-CM | POA: Diagnosis not present

## 2021-07-21 DIAGNOSIS — F1121 Opioid dependence, in remission: Secondary | ICD-10-CM | POA: Diagnosis not present

## 2021-07-21 DIAGNOSIS — F411 Generalized anxiety disorder: Secondary | ICD-10-CM | POA: Diagnosis not present

## 2021-08-17 DIAGNOSIS — Z7151 Drug abuse counseling and surveillance of drug abuser: Secondary | ICD-10-CM | POA: Diagnosis not present

## 2021-08-17 DIAGNOSIS — F1121 Opioid dependence, in remission: Secondary | ICD-10-CM | POA: Diagnosis not present

## 2021-08-17 DIAGNOSIS — F1120 Opioid dependence, uncomplicated: Secondary | ICD-10-CM | POA: Diagnosis not present

## 2021-08-17 DIAGNOSIS — F411 Generalized anxiety disorder: Secondary | ICD-10-CM | POA: Diagnosis not present

## 2021-09-14 DIAGNOSIS — F1121 Opioid dependence, in remission: Secondary | ICD-10-CM | POA: Diagnosis not present

## 2021-09-14 DIAGNOSIS — F411 Generalized anxiety disorder: Secondary | ICD-10-CM | POA: Diagnosis not present

## 2021-10-13 DIAGNOSIS — Z7151 Drug abuse counseling and surveillance of drug abuser: Secondary | ICD-10-CM | POA: Diagnosis not present

## 2021-10-13 DIAGNOSIS — F1121 Opioid dependence, in remission: Secondary | ICD-10-CM | POA: Diagnosis not present

## 2021-10-13 DIAGNOSIS — F411 Generalized anxiety disorder: Secondary | ICD-10-CM | POA: Diagnosis not present

## 2021-11-10 DIAGNOSIS — F1121 Opioid dependence, in remission: Secondary | ICD-10-CM | POA: Diagnosis not present

## 2021-11-10 DIAGNOSIS — F1120 Opioid dependence, uncomplicated: Secondary | ICD-10-CM | POA: Diagnosis not present

## 2021-11-10 DIAGNOSIS — F411 Generalized anxiety disorder: Secondary | ICD-10-CM | POA: Diagnosis not present

## 2021-12-07 DIAGNOSIS — Z7689 Persons encountering health services in other specified circumstances: Secondary | ICD-10-CM | POA: Diagnosis not present

## 2021-12-07 DIAGNOSIS — L570 Actinic keratosis: Secondary | ICD-10-CM | POA: Diagnosis not present

## 2021-12-07 DIAGNOSIS — Z79899 Other long term (current) drug therapy: Secondary | ICD-10-CM | POA: Diagnosis not present

## 2021-12-07 DIAGNOSIS — Z85828 Personal history of other malignant neoplasm of skin: Secondary | ICD-10-CM | POA: Diagnosis not present

## 2021-12-07 DIAGNOSIS — F1121 Opioid dependence, in remission: Secondary | ICD-10-CM | POA: Diagnosis not present

## 2021-12-07 DIAGNOSIS — F411 Generalized anxiety disorder: Secondary | ICD-10-CM | POA: Diagnosis not present

## 2021-12-07 DIAGNOSIS — Z7151 Drug abuse counseling and surveillance of drug abuser: Secondary | ICD-10-CM | POA: Diagnosis not present

## 2021-12-07 DIAGNOSIS — L400 Psoriasis vulgaris: Secondary | ICD-10-CM | POA: Diagnosis not present

## 2021-12-11 IMAGING — CR DG CHEST 2V
1 series · 3 of 3 positions shown · non-contrast
Comparison: None.

CLINICAL DATA: COVID positive with short of breath

EXAM:
CHEST - 2 VIEW

[Series 1: dg chest 2 view · 0.14mm/px · 3 of 3 slices shown]
[im 1/3]
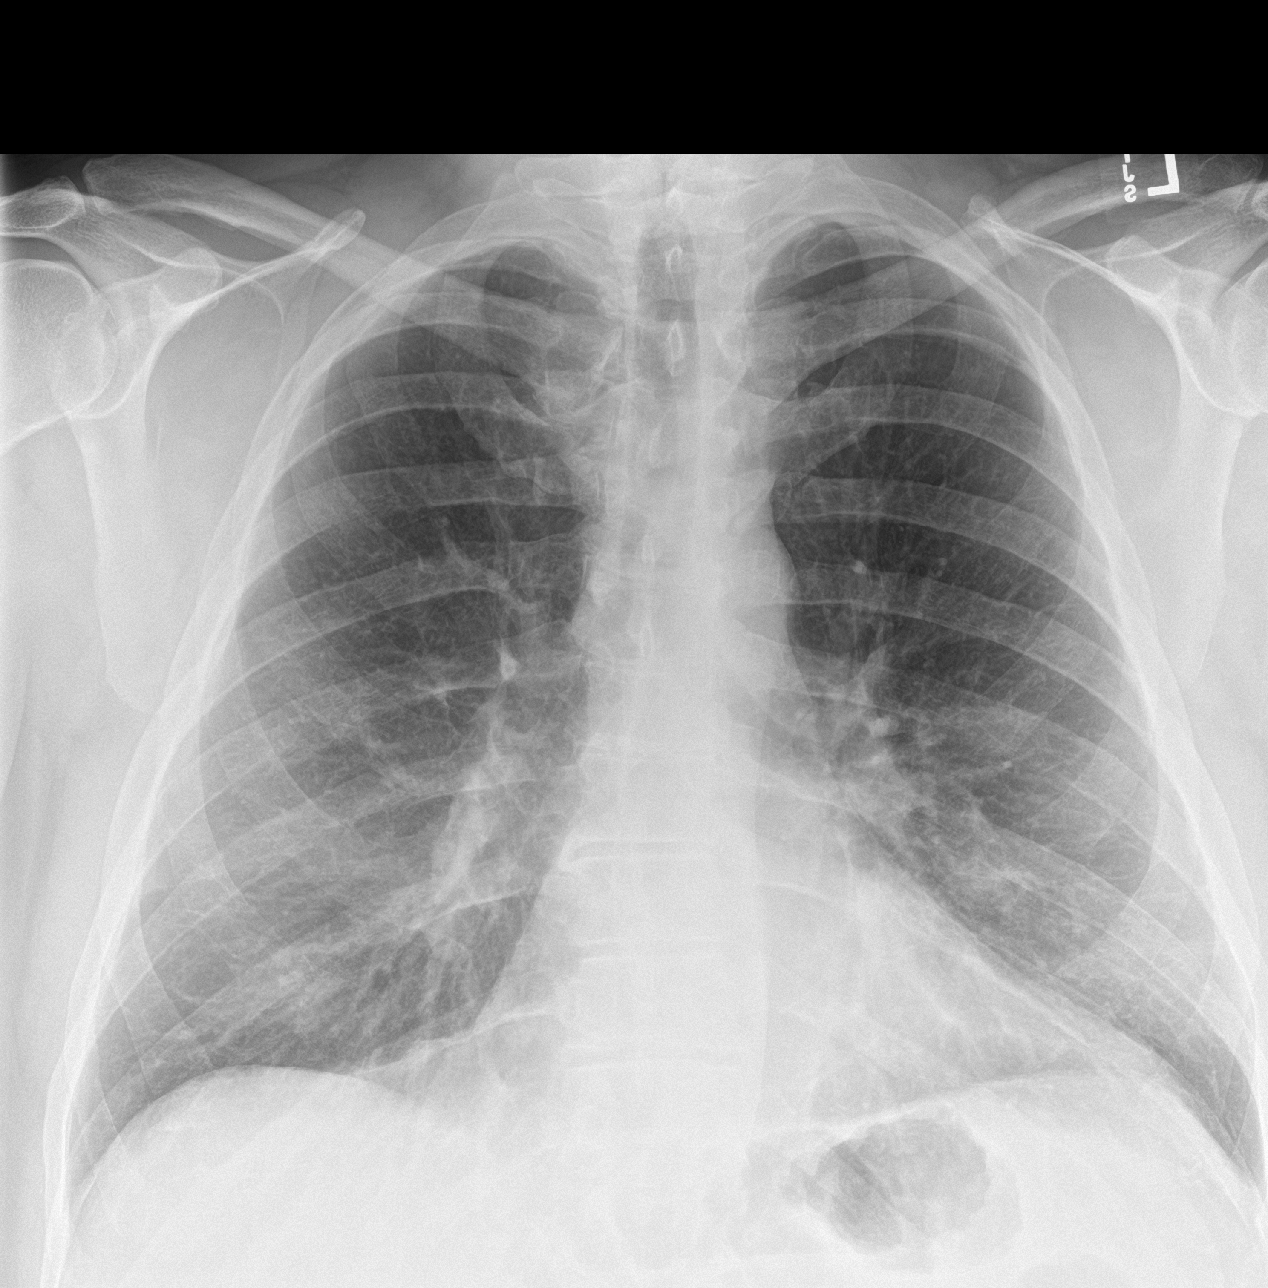
[im 2/3]
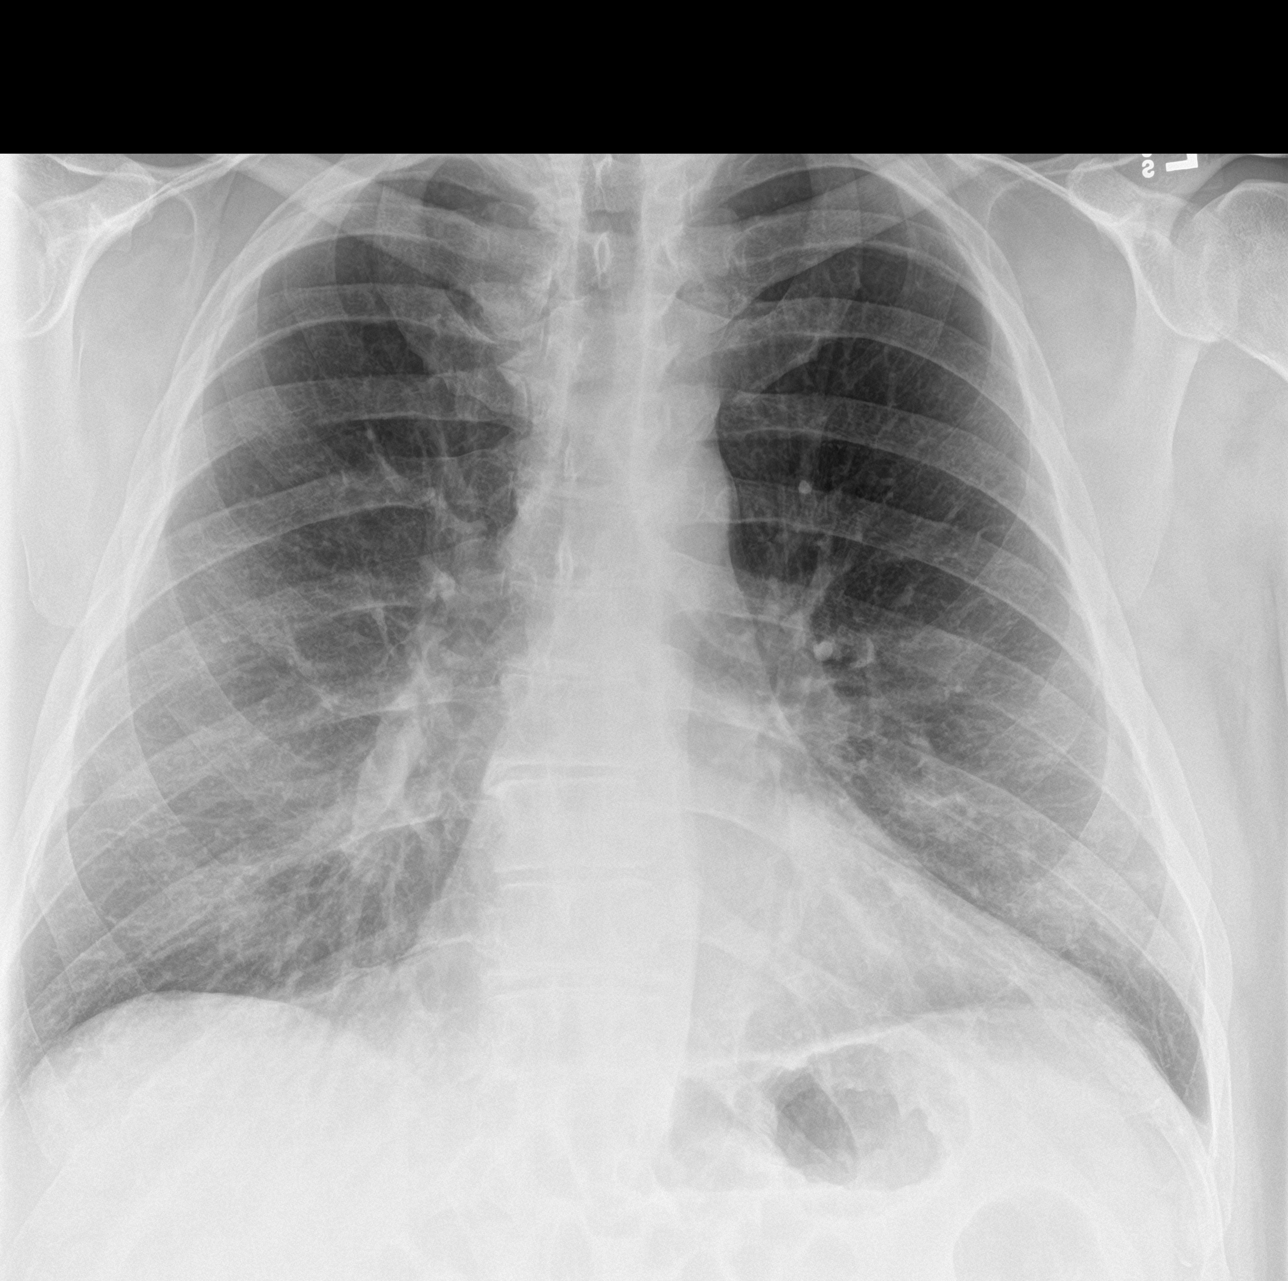
[im 3/3]
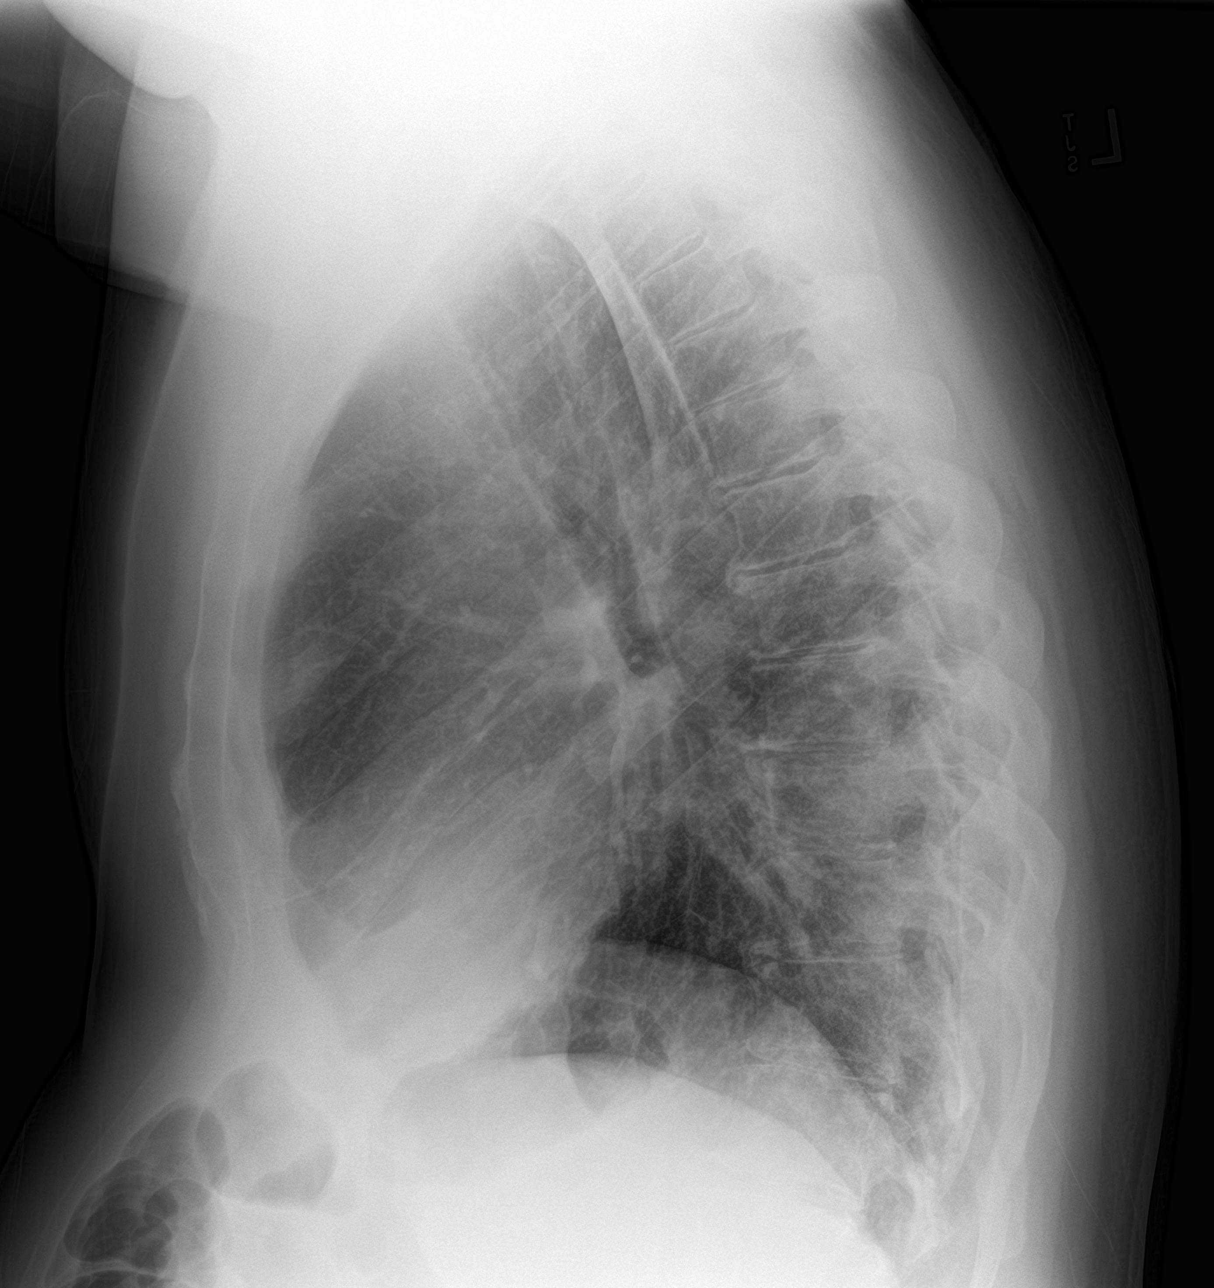

[3 of 3 positions shown; findings below may reference images not displayed]

FINDINGS: Cardiac and mediastinal contours normal. Pulmonary vascularity
normal.

Mild streaky markings in the bases could be atelectasis. No
infiltrate or effusion.
IMPRESSION: Mild streaky markings lung bases possibly atelectasis. No lobar
infiltrate.

## 2021-12-22 DIAGNOSIS — L400 Psoriasis vulgaris: Secondary | ICD-10-CM | POA: Diagnosis not present

## 2022-01-11 DIAGNOSIS — F1120 Opioid dependence, uncomplicated: Secondary | ICD-10-CM | POA: Diagnosis not present

## 2022-01-11 DIAGNOSIS — F1121 Opioid dependence, in remission: Secondary | ICD-10-CM | POA: Diagnosis not present

## 2022-01-11 DIAGNOSIS — F411 Generalized anxiety disorder: Secondary | ICD-10-CM | POA: Diagnosis not present

## 2022-02-04 ENCOUNTER — Ambulatory Visit: Payer: BC Managed Care – PPO | Admitting: Physician Assistant

## 2022-02-04 NOTE — Progress Notes (Deleted)
?  ? ? ?  I,Jana Maryuri Warnke,acting as a Neurosurgeon for [employer], PA-C.,have documented all relevant documentation on the behalf of Debera Lat, PA-C,as directed by  [employer], PA-C while in the presence of [employer], PA-C.  ?Established patient visit ? ? ?Patient: Ronald Mccoy   DOB: June 30, 1971   51 y.o. Male  MRN: [id number] ?Visit Date: 02/04/2022 ? ?Today's healthcare provider: Debera Lat, PA-C  ? ?No chief complaint on file. ? ?Subjective  ?  ?HPI  ?*** ? ?Medications: ?Outpatient Medications Prior to Visit  ?Medication Sig  ? albuterol (VENTOLIN HFA) 108 (90 Base) MCG/ACT inhaler Inhale 2 puffs into the lungs every 6 (six) hours as needed for wheezing or shortness of breath.  ? Buprenorphine HCl-Naloxone HCl (SUBOXONE) 8-2 MG FILM Place under the tongue. 1 1/2 films daily  ? clobetasol (TEMOVATE) 0.05 % external solution Apply 1 application topically 2 (two) times daily.  ? clobetasol ointment (TEMOVATE) 0.05 % Apply 1 application topically 2 (two) times daily.  ? desonide (DESOWEN) 0.05 % cream Apply topically 2 (two) times daily.  ? Multiple Vitamin (MULTIVITAMIN) tablet Take 1 tablet by mouth daily.  ? nystatin ointment (MYCOSTATIN) Apply 1 application topically 2 (two) times daily.  ? Probiotic Product (PROBIOTIC ADVANCED PO) Take by mouth.  ? ?No facility-administered medications prior to visit.  ? ? ?Review of Systems ? ?{Labs  Heme  Chem  Endocrine  Serology  Results Review (optional):23779} ?  Objective  ?  ?There were no vitals taken for this visit. ?{Show previous vital signs (optional):23777} ? ?Physical Exam  ?*** ? ?No results found for any visits on 02/04/22. ? Assessment & Plan  ?  ? ?*** ? ?No follow-ups on file.  ?   ? ?{provider attestation***:1} ? ? ?Debera Lat, PA-C  ?[facility] ?[phone] (phone) [phone] (fax) ? ?[facility] ?

## 2022-02-08 DIAGNOSIS — Z7151 Drug abuse counseling and surveillance of drug abuser: Secondary | ICD-10-CM | POA: Diagnosis not present

## 2022-02-08 DIAGNOSIS — F1121 Opioid dependence, in remission: Secondary | ICD-10-CM | POA: Diagnosis not present

## 2022-02-08 DIAGNOSIS — F411 Generalized anxiety disorder: Secondary | ICD-10-CM | POA: Diagnosis not present

## 2022-03-08 DIAGNOSIS — F1121 Opioid dependence, in remission: Secondary | ICD-10-CM | POA: Diagnosis not present

## 2022-03-08 DIAGNOSIS — F411 Generalized anxiety disorder: Secondary | ICD-10-CM | POA: Diagnosis not present

## 2022-03-08 DIAGNOSIS — F1120 Opioid dependence, uncomplicated: Secondary | ICD-10-CM | POA: Diagnosis not present

## 2022-04-05 DIAGNOSIS — F411 Generalized anxiety disorder: Secondary | ICD-10-CM | POA: Diagnosis not present

## 2022-04-05 DIAGNOSIS — F1121 Opioid dependence, in remission: Secondary | ICD-10-CM | POA: Diagnosis not present

## 2022-04-05 DIAGNOSIS — Z7151 Drug abuse counseling and surveillance of drug abuser: Secondary | ICD-10-CM | POA: Diagnosis not present

## 2022-05-04 DIAGNOSIS — F1120 Opioid dependence, uncomplicated: Secondary | ICD-10-CM | POA: Diagnosis not present

## 2022-05-04 DIAGNOSIS — F411 Generalized anxiety disorder: Secondary | ICD-10-CM | POA: Diagnosis not present

## 2022-05-04 DIAGNOSIS — F1121 Opioid dependence, in remission: Secondary | ICD-10-CM | POA: Diagnosis not present

## 2022-05-31 DIAGNOSIS — Z7151 Drug abuse counseling and surveillance of drug abuser: Secondary | ICD-10-CM | POA: Diagnosis not present

## 2022-05-31 DIAGNOSIS — F411 Generalized anxiety disorder: Secondary | ICD-10-CM | POA: Diagnosis not present

## 2022-05-31 DIAGNOSIS — F1121 Opioid dependence, in remission: Secondary | ICD-10-CM | POA: Diagnosis not present

## 2022-06-13 DIAGNOSIS — Z7689 Persons encountering health services in other specified circumstances: Secondary | ICD-10-CM | POA: Diagnosis not present

## 2022-06-13 DIAGNOSIS — L918 Other hypertrophic disorders of the skin: Secondary | ICD-10-CM | POA: Diagnosis not present

## 2022-06-13 DIAGNOSIS — L400 Psoriasis vulgaris: Secondary | ICD-10-CM | POA: Diagnosis not present

## 2022-06-13 DIAGNOSIS — Z79899 Other long term (current) drug therapy: Secondary | ICD-10-CM | POA: Diagnosis not present

## 2022-06-28 DIAGNOSIS — F1190 Opioid use, unspecified, uncomplicated: Secondary | ICD-10-CM | POA: Diagnosis not present

## 2022-06-28 DIAGNOSIS — F411 Generalized anxiety disorder: Secondary | ICD-10-CM | POA: Diagnosis not present

## 2022-06-29 DIAGNOSIS — Z7689 Persons encountering health services in other specified circumstances: Secondary | ICD-10-CM | POA: Diagnosis not present

## 2022-07-26 DIAGNOSIS — F1120 Opioid dependence, uncomplicated: Secondary | ICD-10-CM | POA: Diagnosis not present

## 2022-07-26 DIAGNOSIS — Z7151 Drug abuse counseling and surveillance of drug abuser: Secondary | ICD-10-CM | POA: Diagnosis not present

## 2022-07-26 DIAGNOSIS — F1121 Opioid dependence, in remission: Secondary | ICD-10-CM | POA: Diagnosis not present

## 2022-07-26 DIAGNOSIS — F411 Generalized anxiety disorder: Secondary | ICD-10-CM | POA: Diagnosis not present

## 2022-08-23 DIAGNOSIS — F411 Generalized anxiety disorder: Secondary | ICD-10-CM | POA: Diagnosis not present

## 2022-08-23 DIAGNOSIS — F1111 Opioid abuse, in remission: Secondary | ICD-10-CM | POA: Diagnosis not present

## 2022-09-20 DIAGNOSIS — F1111 Opioid abuse, in remission: Secondary | ICD-10-CM | POA: Diagnosis not present

## 2022-09-20 DIAGNOSIS — Z7151 Drug abuse counseling and surveillance of drug abuser: Secondary | ICD-10-CM | POA: Diagnosis not present

## 2022-09-20 DIAGNOSIS — F411 Generalized anxiety disorder: Secondary | ICD-10-CM | POA: Diagnosis not present

## 2022-09-21 DIAGNOSIS — F1111 Opioid abuse, in remission: Secondary | ICD-10-CM | POA: Diagnosis not present

## 2022-09-21 DIAGNOSIS — F411 Generalized anxiety disorder: Secondary | ICD-10-CM | POA: Diagnosis not present

## 2022-09-21 DIAGNOSIS — Z79891 Long term (current) use of opiate analgesic: Secondary | ICD-10-CM | POA: Diagnosis not present

## 2022-10-19 DIAGNOSIS — F1111 Opioid abuse, in remission: Secondary | ICD-10-CM | POA: Diagnosis not present

## 2022-10-19 DIAGNOSIS — F411 Generalized anxiety disorder: Secondary | ICD-10-CM | POA: Diagnosis not present

## 2022-11-15 DIAGNOSIS — F411 Generalized anxiety disorder: Secondary | ICD-10-CM | POA: Diagnosis not present

## 2022-11-15 DIAGNOSIS — Z7151 Drug abuse counseling and surveillance of drug abuser: Secondary | ICD-10-CM | POA: Diagnosis not present

## 2022-11-15 DIAGNOSIS — Z79891 Long term (current) use of opiate analgesic: Secondary | ICD-10-CM | POA: Diagnosis not present

## 2022-11-16 DIAGNOSIS — F1111 Opioid abuse, in remission: Secondary | ICD-10-CM | POA: Diagnosis not present

## 2022-11-16 DIAGNOSIS — F411 Generalized anxiety disorder: Secondary | ICD-10-CM | POA: Diagnosis not present

## 2022-12-13 DIAGNOSIS — F1111 Opioid abuse, in remission: Secondary | ICD-10-CM | POA: Diagnosis not present

## 2022-12-13 DIAGNOSIS — F411 Generalized anxiety disorder: Secondary | ICD-10-CM | POA: Diagnosis not present

## 2023-01-02 NOTE — Progress Notes (Deleted)
Complete physical exam   Patient: Ronald Mccoy   DOB: June 09, 1971   52 y.o. Male  MRN: [id number] Visit Date: 01/03/2023  Today's healthcare provider: Lavon Paganini, MD   No chief complaint on file.  Subjective    Ronald Mccoy is a 52 y.o. male who presents today for a complete physical exam.  He reports consuming a {diet types:17450} diet. {Exercise:19826} He generally feels {well/fairly well/poorly:18703}. He reports sleeping {well/fairly well/poorly:18703}. He {does/does not:200015} have additional problems to discuss today.  HPI  ***  Past Medical History:  Diagnosis Date   Psoriasis 2012   Shingles 2017   Past Surgical History:  Procedure Laterality Date   TONSILLECTOMY  1978   Social History   Socioeconomic History   Marital status: Married    Spouse name: April   Number of children: 2   Years of education: 14   Highest education level: Not on file  Occupational History    Employer: [employer]  Tobacco Use   Smoking status: Former    Types: Cigarettes   Smokeless tobacco: Never   Tobacco comments:    was social smoker  Scientific laboratory technician Use: Never used  Substance and Sexual Activity   Alcohol use: No   Drug use: No   Sexual activity: Not on file  Other Topics Concern   Not on file  Social History Narrative   Not on file   Social Determinants of Health   Financial Resource Strain: Not on file  Food Insecurity: Not on file  Transportation Needs: Not on file  Physical Activity: Not on file  Stress: Not on file  Social Connections: Not on file  Intimate Partner Violence: Not on file   Family Status  Relation Name Status   Mother  Alive   Father  Deceased   Brother  Alive   Brother  Alive   Brother  Alive   Family History  Problem Relation Age of Onset   Breast cancer Mother        in remission   Cirrhosis Father    COPD Father    Heart attack Father 63   Colon polyps Father        Late 67's   Healthy Brother     Healthy Brother    Healthy Brother    No Known Allergies  Patient Care Team: Virginia Crews, MD as PCP - General (Family Medicine)   Medications: Outpatient Medications Prior to Visit  Medication Sig   albuterol (VENTOLIN HFA) 108 (90 Base) MCG/ACT inhaler Inhale 2 puffs into the lungs every 6 (six) hours as needed for wheezing or shortness of breath.   Buprenorphine HCl-Naloxone HCl (SUBOXONE) 8-2 MG FILM Place under the tongue. 1 1/2 films daily   clobetasol (TEMOVATE) 0.05 % external solution Apply 1 application topically 2 (two) times daily.   clobetasol ointment (TEMOVATE) AB-123456789 % Apply 1 application topically 2 (two) times daily.   desonide (DESOWEN) 0.05 % cream Apply topically 2 (two) times daily.   Multiple Vitamin (MULTIVITAMIN) tablet Take 1 tablet by mouth daily.   nystatin ointment (MYCOSTATIN) Apply 1 application topically 2 (two) times daily.   Probiotic Product (PROBIOTIC ADVANCED PO) Take by mouth.   No facility-administered medications prior to visit.    Review of Systems  All other systems reviewed and are negative.   {Labs  Heme  Chem  Endocrine  Serology  Results Review (optional):23779}  Objective    There were  no vitals taken for this visit. {Show previous vital signs (optional):23777}   Physical Exam  ***  Last depression screening scores    12/31/2019   10:06 AM 12/28/2018    3:00 PM 07/10/2017    3:05 PM  PHQ 2/9 Scores  PHQ - 2 Score 0 0 0  PHQ- 9 Score 0 0    Last fall risk screening    12/31/2019   10:07 AM  Fall Risk   Falls in the past year? 0  Number falls in past yr: 0  Injury with Fall? 0  Follow up Falls evaluation completed   Last Audit-C alcohol use screening    12/31/2019   10:07 AM  Alcohol Use Disorder Test (AUDIT)  1. How often do you have a drink containing alcohol? 0  2. How many drinks containing alcohol do you have on a typical day when you are drinking? 0  3. How often do you have six or more drinks on  one occasion? 0  AUDIT-C Score 0   A score of 3 or more in women, and 4 or more in men indicates increased risk for alcohol abuse, EXCEPT if all of the points are from question 1   No results found for any visits on 01/03/23.  Assessment & Plan    Routine Health Maintenance and Physical Exam  Exercise Activities and Dietary recommendations  Goals   None     Immunization History  Administered Date(s) Administered   Influenza,inj,Quad PF,6+ Mos 07/17/2019    Health Maintenance  Topic Date Due   COVID-19 Vaccine (1) Never done   Hepatitis C Screening  Never done   DTaP/Tdap/Td (1 - Tdap) Never done   Zoster Vaccines- Shingrix (1 of 2) Never done   COLONOSCOPY (Pts 45-44yr Insurance coverage will need to be confirmed)  Never done   INFLUENZA VACCINE  06/14/2022   HIV Screening  Completed   HPV VACCINES  Aged Out    Discussed health benefits of physical activity, and encouraged him to engage in regular exercise appropriate for his age and condition.  ***  No follow-ups on file.     {provider attestation***:1}   ALavon Paganini MD  [phone](phone) [phone](fax)  [place]

## 2023-01-03 ENCOUNTER — Encounter: Payer: BC Managed Care – PPO | Admitting: Family Medicine

## 2023-01-05 NOTE — Progress Notes (Deleted)
I,Kyanna Mahrt S Evangelos Paulino,acting as a Education administrator for Lavon Paganini, MD.,have documented all relevant documentation on the behalf of Lavon Paganini, MD,as directed by  Lavon Paganini, MD while in the presence of Lavon Paganini, MD.    Complete physical exam   Patient: Ronald Mccoy   DOB: 02-07-1971   52 y.o. Male  MRN: [id number] Visit Date: 01/06/2023  Today's healthcare provider: Lavon Paganini, MD   No chief complaint on file.  Subjective    Ronald Mccoy is a 52 y.o. male who presents today for a complete physical exam.  He reports consuming a {diet types:17450} diet. {Exercise:19826} He generally feels {well/fairly well/poorly:18703}. He reports sleeping {well/fairly well/poorly:18703}. He {does/does not:200015} have additional problems to discuss today.  HPI  Tdap- Shingles vaccine- Influenza vaccine- Colonoscopy-   Past Medical History:  Diagnosis Date   Psoriasis 2012   Shingles 2017   Past Surgical History:  Procedure Laterality Date   TONSILLECTOMY  1978   Social History   Socioeconomic History   Marital status: Married    Spouse name: April   Number of children: 2   Years of education: 14   Highest education level: Not on file  Occupational History    Employer: [employer]  Tobacco Use   Smoking status: Former    Types: Cigarettes   Smokeless tobacco: Never   Tobacco comments:    was social smoker  Scientific laboratory technician Use: Never used  Substance and Sexual Activity   Alcohol use: No   Drug use: No   Sexual activity: Not on file  Other Topics Concern   Not on file  Social History Narrative   Not on file   Social Determinants of Health   Financial Resource Strain: Not on file  Food Insecurity: Not on file  Transportation Needs: Not on file  Physical Activity: Not on file  Stress: Not on file  Social Connections: Not on file  Intimate Partner Violence: Not on file   Family Status  Relation Name Status   Mother  Alive    Father  Deceased   Brother  Alive   Brother  Alive   Brother  Alive   Family History  Problem Relation Age of Onset   Breast cancer Mother        in remission   Cirrhosis Father    COPD Father    Heart attack Father 38   Colon polyps Father        Late 33's   Healthy Brother    Healthy Brother    Healthy Brother    No Known Allergies  Patient Care Team: Virginia Crews, MD as PCP - General (Family Medicine)   Medications: Outpatient Medications Prior to Visit  Medication Sig   albuterol (VENTOLIN HFA) 108 (90 Base) MCG/ACT inhaler Inhale 2 puffs into the lungs every 6 (six) hours as needed for wheezing or shortness of breath.   Buprenorphine HCl-Naloxone HCl (SUBOXONE) 8-2 MG FILM Place under the tongue. 1 1/2 films daily   clobetasol (TEMOVATE) 0.05 % external solution Apply 1 application topically 2 (two) times daily.   clobetasol ointment (TEMOVATE) AB-123456789 % Apply 1 application topically 2 (two) times daily.   desonide (DESOWEN) 0.05 % cream Apply topically 2 (two) times daily.   Multiple Vitamin (MULTIVITAMIN) tablet Take 1 tablet by mouth daily.   nystatin ointment (MYCOSTATIN) Apply 1 application topically 2 (two) times daily.   Probiotic Product (PROBIOTIC ADVANCED PO) Take by mouth.  No facility-administered medications prior to visit.    Review of Systems  All other systems reviewed and are negative.   Last CBC Lab Results  Component Value Date   WBC 9.1 12/31/2019   HGB 15.5 12/31/2019   HCT 45.2 12/31/2019   MCV 84 12/31/2019   MCH 28.9 12/31/2019   RDW 12.5 12/31/2019   PLT 290 0000000   Last metabolic panel Lab Results  Component Value Date   GLUCOSE 103 (H) 12/31/2019   NA 137 12/31/2019   K 4.1 12/31/2019   CL 97 12/31/2019   CO2 24 12/31/2019   BUN 9 12/31/2019   CREATININE 0.81 12/31/2019   GFRNONAA 106 12/31/2019   CALCIUM 9.5 12/31/2019   PROT 8.2 12/31/2019   ALBUMIN 4.3 12/31/2019   LABGLOB 3.9 12/31/2019   AGRATIO 1.1 (L)  12/31/2019   BILITOT 0.5 12/31/2019   ALKPHOS 89 12/31/2019   AST 21 12/31/2019   ALT 13 12/31/2019   Last lipids Lab Results  Component Value Date   CHOL 160 12/31/2019   HDL 49 12/31/2019   LDLCALC 94 12/31/2019   TRIG 91 12/31/2019   CHOLHDL 3.3 12/31/2019   Last hemoglobin A1c Lab Results  Component Value Date   HGBA1C 5.8 (H) 12/31/2019   Last thyroid functions Lab Results  Component Value Date   TSH 1.570 12/31/2019      Objective    There were no vitals taken for this visit. BP Readings from Last 3 Encounters:  10/02/20 (!) 149/84  12/31/19 (!) 146/78  07/18/19 (!) 156/82   Wt Readings from Last 3 Encounters:  10/02/20 (!) 313 lb (142 kg)  12/31/19 (!) 301 lb (136.5 kg)  07/18/19 297 lb (134.7 kg)       Physical Exam  ***  Last depression screening scores    12/31/2019   10:06 AM 12/28/2018    3:00 PM 07/10/2017    3:05 PM  PHQ 2/9 Scores  PHQ - 2 Score 0 0 0  PHQ- 9 Score 0 0    Last fall risk screening    12/31/2019   10:07 AM  Fall Risk   Falls in the past year? 0  Number falls in past yr: 0  Injury with Fall? 0  Follow up Falls evaluation completed   Last Audit-C alcohol use screening    12/31/2019   10:07 AM  Alcohol Use Disorder Test (AUDIT)  1. How often do you have a drink containing alcohol? 0  2. How many drinks containing alcohol do you have on a typical day when you are drinking? 0  3. How often do you have six or more drinks on one occasion? 0  AUDIT-C Score 0   A score of 3 or more in women, and 4 or more in men indicates increased risk for alcohol abuse, EXCEPT if all of the points are from question 1   No results found for any visits on 01/06/23.  Assessment & Plan    Routine Health Maintenance and Physical Exam  Exercise Activities and Dietary recommendations  Goals   None     Immunization History  Administered Date(s) Administered   Influenza,inj,Quad PF,6+ Mos 07/17/2019    Health Maintenance  Topic  Date Due   COVID-19 Vaccine (1) Never done   Hepatitis C Screening  Never done   DTaP/Tdap/Td (1 - Tdap) Never done   Zoster Vaccines- Shingrix (1 of 2) Never done   COLONOSCOPY (Pts 45-49yr Insurance coverage will need to be confirmed)  Never done   INFLUENZA VACCINE  06/14/2022   HIV Screening  Completed   HPV VACCINES  Aged Out    Discussed health benefits of physical activity, and encouraged him to engage in regular exercise appropriate for his age and condition.  ***  No follow-ups on file.     {provider attestation***:1}   Lavon Paganini, MD  [facility] [phone] (phone) [phone] (fax)  [place]

## 2023-01-06 ENCOUNTER — Encounter: Payer: BC Managed Care – PPO | Admitting: Family Medicine

## 2023-01-06 DIAGNOSIS — Z0000 Encounter for general adult medical examination without abnormal findings: Secondary | ICD-10-CM

## 2023-01-09 NOTE — Progress Notes (Unsigned)
I,Ronald Mccoy S Ronald Mccoy,acting as a Education administrator for Ronald Paganini, MD.,have documented all relevant documentation on the behalf of Ronald Paganini, MD,as directed by  Ronald Paganini, MD while in the presence of Ronald Paganini, MD.    Complete physical exam   Patient: Ronald Mccoy   DOB: 13-Dec-1970   52 y.o. Male  MRN: [id number] Visit Date: 01/10/2023  Today's healthcare provider: Lavon Paganini, MD   No chief complaint on file.  Subjective    Ronald RODRIQUES is a 52 y.o. male who presents today for a complete physical exam.  He reports consuming a {diet types:17450} diet. {Exercise:19826} He generally feels {well/fairly well/poorly:18703}. He reports sleeping {well/fairly well/poorly:18703}. He {does/does not:200015} have additional problems to discuss today.  HPI  Tdap vaccine- Shingles vaccine- Influenza vaccine- Colonoscopy-   Past Medical History:  Diagnosis Date   Psoriasis 2012   Shingles 2017   Past Surgical History:  Procedure Laterality Date   TONSILLECTOMY  1978   Social History   Socioeconomic History   Marital status: Married    Spouse name: April   Number of children: 2   Years of education: 14   Highest education level: Not on file  Occupational History    Employer: [employer]  Tobacco Use   Smoking status: Former    Types: Cigarettes   Smokeless tobacco: Never   Tobacco comments:    was social smoker  Scientific laboratory technician Use: Never used  Substance and Sexual Activity   Alcohol use: No   Drug use: No   Sexual activity: Not on file  Other Topics Concern   Not on file  Social History Narrative   Not on file   Social Determinants of Health   Financial Resource Strain: Not on file  Food Insecurity: Not on file  Transportation Needs: Not on file  Physical Activity: Not on file  Stress: Not on file  Social Connections: Not on file  Intimate Partner Violence: Not on file   Family Status  Relation Name Status   Mother  Alive    Father  Deceased   Brother  Alive   Brother  Alive   Brother  Alive   Family History  Problem Relation Age of Onset   Breast cancer Mother        in remission   Cirrhosis Father    COPD Father    Heart attack Father 42   Colon polyps Father        Late 78's   Healthy Brother    Healthy Brother    Healthy Brother    No Known Allergies  Patient Care Team: Virginia Crews, MD as PCP - General (Family Medicine)   Medications: Outpatient Medications Prior to Visit  Medication Sig   albuterol (VENTOLIN HFA) 108 (90 Base) MCG/ACT inhaler Inhale 2 puffs into the lungs every 6 (six) hours as needed for wheezing or shortness of breath.   Buprenorphine HCl-Naloxone HCl (SUBOXONE) 8-2 MG FILM Place under the tongue. 1 1/2 films daily   clobetasol (TEMOVATE) 0.05 % external solution Apply 1 application topically 2 (two) times daily.   clobetasol ointment (TEMOVATE) AB-123456789 % Apply 1 application topically 2 (two) times daily.   desonide (DESOWEN) 0.05 % cream Apply topically 2 (two) times daily.   Multiple Vitamin (MULTIVITAMIN) tablet Take 1 tablet by mouth daily.   nystatin ointment (MYCOSTATIN) Apply 1 application topically 2 (two) times daily.   Probiotic Product (PROBIOTIC ADVANCED PO) Take by mouth.  No facility-administered medications prior to visit.    Review of Systems  All other systems reviewed and are negative.   {Labs  Heme  Chem  Endocrine  Serology  Results Review (optional):23779}  Objective    There were no vitals taken for this visit. {Show previous vital signs (optional):23777}   Physical Exam  ***  Last depression screening scores    12/31/2019   10:06 AM 12/28/2018    3:00 PM 07/10/2017    3:05 PM  PHQ 2/9 Scores  PHQ - 2 Score 0 0 0  PHQ- 9 Score 0 0    Last fall risk screening    12/31/2019   10:07 AM  Fall Risk   Falls in the past year? 0  Number falls in past yr: 0  Injury with Fall? 0  Follow up Falls evaluation completed    Last Audit-C alcohol use screening    12/31/2019   10:07 AM  Alcohol Use Disorder Test (AUDIT)  1. How often do you have a drink containing alcohol? 0  2. How many drinks containing alcohol do you have on a typical day when you are drinking? 0  3. How often do you have six or more drinks on one occasion? 0  AUDIT-C Score 0   A score of 3 or more in women, and 4 or more in men indicates increased risk for alcohol abuse, EXCEPT if all of the points are from question 1   No results found for any visits on 01/10/23.  Assessment & Plan    Routine Health Maintenance and Physical Exam  Exercise Activities and Dietary recommendations  Goals   None     Immunization History  Administered Date(s) Administered   Influenza,inj,Quad PF,6+ Mos 07/17/2019    Health Maintenance  Topic Date Due   COVID-19 Vaccine (1) Never done   Hepatitis C Screening  Never done   DTaP/Tdap/Td (1 - Tdap) Never done   Zoster Vaccines- Shingrix (1 of 2) Never done   COLONOSCOPY (Pts 45-23yr Insurance coverage will need to be confirmed)  Never done   INFLUENZA VACCINE  06/14/2022   HIV Screening  Completed   HPV VACCINES  Aged Out    Discussed health benefits of physical activity, and encouraged him to engage in regular exercise appropriate for his age and condition.  ***  No follow-ups on file.     {provider attestation***:1}   ALavon Paganini MD  [phone](phone) [phone](fax)  [place]

## 2023-01-10 ENCOUNTER — Ambulatory Visit (INDEPENDENT_AMBULATORY_CARE_PROVIDER_SITE_OTHER): Payer: BC Managed Care – PPO | Admitting: Family Medicine

## 2023-01-10 ENCOUNTER — Encounter: Payer: Self-pay | Admitting: Family Medicine

## 2023-01-10 VITALS — BP 139/72 | HR 106 | Temp 98.1°F | Resp 16 | Ht 74.0 in | Wt 317.4 lb

## 2023-01-10 DIAGNOSIS — Z1159 Encounter for screening for other viral diseases: Secondary | ICD-10-CM | POA: Diagnosis not present

## 2023-01-10 DIAGNOSIS — R7303 Prediabetes: Secondary | ICD-10-CM | POA: Diagnosis not present

## 2023-01-10 DIAGNOSIS — F411 Generalized anxiety disorder: Secondary | ICD-10-CM | POA: Diagnosis not present

## 2023-01-10 DIAGNOSIS — Z0000 Encounter for general adult medical examination without abnormal findings: Secondary | ICD-10-CM | POA: Diagnosis not present

## 2023-01-10 DIAGNOSIS — M7552 Bursitis of left shoulder: Secondary | ICD-10-CM | POA: Diagnosis not present

## 2023-01-10 DIAGNOSIS — F1120 Opioid dependence, uncomplicated: Secondary | ICD-10-CM | POA: Diagnosis not present

## 2023-01-10 DIAGNOSIS — R030 Elevated blood-pressure reading, without diagnosis of hypertension: Secondary | ICD-10-CM | POA: Diagnosis not present

## 2023-01-10 DIAGNOSIS — Z1211 Encounter for screening for malignant neoplasm of colon: Secondary | ICD-10-CM

## 2023-01-10 DIAGNOSIS — Z7151 Drug abuse counseling and surveillance of drug abuser: Secondary | ICD-10-CM | POA: Diagnosis not present

## 2023-01-10 DIAGNOSIS — Z79891 Long term (current) use of opiate analgesic: Secondary | ICD-10-CM | POA: Diagnosis not present

## 2023-01-10 MED ORDER — MELOXICAM 15 MG PO TABS: 15.0000 mg | ORAL_TABLET | Freq: Every day | ORAL | 0 refills | Status: DC | PRN

## 2023-01-10 NOTE — Assessment & Plan Note (Signed)
Recommend low carb diet °Recheck A1c  °

## 2023-01-10 NOTE — Assessment & Plan Note (Signed)
Followed by addiction specialist On Suboxone Avoid other addictive medications

## 2023-01-10 NOTE — Assessment & Plan Note (Signed)
Discussed importance of healthy weight management Discussed diet and exercise  

## 2023-01-10 NOTE — Assessment & Plan Note (Signed)
New problem Mild impingement signs, rotator cuff intact Advised rest, ice, mobic prn, HEP given Return precautions discussed

## 2023-01-10 NOTE — Assessment & Plan Note (Signed)
Has whitecoat hypertension Home readings are ok Continue to monitor home readings Discussed lifestyle management

## 2023-01-11 ENCOUNTER — Telehealth: Payer: Self-pay

## 2023-01-11 ENCOUNTER — Other Ambulatory Visit: Payer: Self-pay

## 2023-01-11 ENCOUNTER — Encounter: Payer: Self-pay | Admitting: Family Medicine

## 2023-01-11 DIAGNOSIS — R5383 Other fatigue: Secondary | ICD-10-CM

## 2023-01-11 DIAGNOSIS — Z1211 Encounter for screening for malignant neoplasm of colon: Secondary | ICD-10-CM

## 2023-01-11 MED ORDER — NA SULFATE-K SULFATE-MG SULF 17.5-3.13-1.6 GM/177ML PO SOLN: 1.0000 | Freq: Once | ORAL | 0 refills | Status: AC

## 2023-01-11 NOTE — Telephone Encounter (Signed)
Gastroenterology Pre-Procedure Review  Request Date: 02/09/23 Requesting Physician: Dr. Vicente Males  PATIENT REVIEW QUESTIONS: The patient responded to the following health history questions as indicated:    1. Are you having any GI issues? no 2. Do you have a personal history of Polyps? no 3. Do you have a family history of Colon Cancer or Polyps? no 4. Diabetes Mellitus? no 5. Joint replacements in the past 12 months?no 6. Major health problems in the past 3 months?no 7. Any artificial heart valves, MVP, or defibrillator?no    MEDICATIONS & ALLERGIES:    Patient reports the following regarding taking any anticoagulation/antiplatelet therapy:   Plavix, Coumadin, Eliquis, Xarelto, Lovenox, Pradaxa, Brilinta, or Effient? no Aspirin? no  Patient confirms/reports the following medications:  Current Outpatient Medications  Medication Sig Dispense Refill   Buprenorphine HCl-Naloxone HCl (SUBOXONE) 8-2 MG FILM Place under the tongue. 1 1/2 films daily     clobetasol (TEMOVATE) 0.05 % external solution Apply 1 application topically 2 (two) times daily. 50 mL 0   clobetasol ointment (TEMOVATE) [id number] % Apply 1 application topically 2 (two) times daily. 60 g 2   meloxicam (MOBIC) 15 MG tablet Take 1 tablet (15 mg total) by mouth daily as needed for pain. 30 tablet 0   Multiple Vitamin (MULTIVITAMIN) tablet Take 1 tablet by mouth daily.     nystatin ointment (MYCOSTATIN) Apply 1 application topically 2 (two) times daily. 30 g 2   Probiotic Product (PROBIOTIC ADVANCED PO) Take by mouth.     TREMFYA 100 MG/ML SOPN Inject into the skin. Every 8 weeks     No current facility-administered medications for this visit.    Patient confirms/reports the following allergies:  No Known Allergies  No orders of the defined types were placed in this encounter.   AUTHORIZATION INFORMATION Primary Insurance: 1D#: Group #:  Secondary Insurance: 1D#: Group #:  SCHEDULE INFORMATION: Date:  02/09/23 Time: Location: [facility]

## 2023-01-17 DIAGNOSIS — Z79899 Other long term (current) drug therapy: Secondary | ICD-10-CM | POA: Diagnosis not present

## 2023-01-17 DIAGNOSIS — L400 Psoriasis vulgaris: Secondary | ICD-10-CM | POA: Diagnosis not present

## 2023-01-30 ENCOUNTER — Telehealth: Payer: Self-pay | Admitting: Gastroenterology

## 2023-01-30 NOTE — Telephone Encounter (Signed)
Pt would like call back when June sched opens

## 2023-02-03 DIAGNOSIS — R7303 Prediabetes: Secondary | ICD-10-CM | POA: Diagnosis not present

## 2023-02-03 DIAGNOSIS — Z1159 Encounter for screening for other viral diseases: Secondary | ICD-10-CM | POA: Diagnosis not present

## 2023-02-03 DIAGNOSIS — Z0000 Encounter for general adult medical examination without abnormal findings: Secondary | ICD-10-CM | POA: Diagnosis not present

## 2023-02-05 ENCOUNTER — Encounter: Payer: Self-pay | Admitting: Family Medicine

## 2023-02-05 DIAGNOSIS — R7989 Other specified abnormal findings of blood chemistry: Secondary | ICD-10-CM

## 2023-02-05 DIAGNOSIS — R5383 Other fatigue: Secondary | ICD-10-CM

## 2023-02-06 ENCOUNTER — Telehealth: Payer: Self-pay

## 2023-02-06 DIAGNOSIS — Z1211 Encounter for screening for malignant neoplasm of colon: Secondary | ICD-10-CM

## 2023-02-06 LAB — HEMOGLOBIN A1C
Est. average glucose Bld gHb Est-mCnc: 126 mg/dL
Hgb A1c MFr Bld: 6 % — ABNORMAL HIGH (ref 4.8–5.6)

## 2023-02-06 LAB — TESTOSTERONE,FREE AND TOTAL
Testosterone, Free: 6 pg/mL — ABNORMAL LOW (ref 7.2–24.0)
Testosterone: 154 ng/dL — ABNORMAL LOW (ref 264–916)

## 2023-02-06 LAB — LIPID PANEL WITH LDL/HDL RATIO
Cholesterol, Total: 138 mg/dL (ref 100–199)
HDL: 42 mg/dL (ref >39)
LDL Chol Calc (NIH): 80 mg/dL (ref 0–99)
LDL/HDL Ratio: 1.9 ratio (ref 0.0–3.6)
Triglycerides: 85 mg/dL (ref 0–149)
VLDL Cholesterol Cal: 16 mg/dL (ref 5–40)

## 2023-02-06 LAB — HEPATITIS C ANTIBODY: Hep C Virus Ab: NONREACTIVE

## 2023-02-06 NOTE — Telephone Encounter (Signed)
Returned patients call from Friday to reschedule his 02/09/23 colonoscopy with Dr. Vicente Males.  Colonoscopy has been rescheduled to 03/30/23.  Trish in Endo has been notified of date change.  Referral updated.  Instructions updated.  Thanks,  Sharyn Lull CMA

## 2023-02-08 DIAGNOSIS — F411 Generalized anxiety disorder: Secondary | ICD-10-CM | POA: Diagnosis not present

## 2023-02-15 ENCOUNTER — Ambulatory Visit: Payer: BC Managed Care – PPO | Admitting: Urology

## 2023-03-07 DIAGNOSIS — F1120 Opioid dependence, uncomplicated: Secondary | ICD-10-CM | POA: Diagnosis not present

## 2023-03-07 DIAGNOSIS — F411 Generalized anxiety disorder: Secondary | ICD-10-CM | POA: Diagnosis not present

## 2023-03-07 DIAGNOSIS — Z7151 Drug abuse counseling and surveillance of drug abuser: Secondary | ICD-10-CM | POA: Diagnosis not present

## 2023-03-07 DIAGNOSIS — F1110 Opioid abuse, uncomplicated: Secondary | ICD-10-CM | POA: Diagnosis not present

## 2023-03-09 ENCOUNTER — Encounter: Payer: Self-pay | Admitting: Urology

## 2023-03-09 ENCOUNTER — Ambulatory Visit: Payer: BC Managed Care – PPO | Admitting: Urology

## 2023-03-09 VITALS — BP 180/95 | HR 105 | Ht 74.0 in | Wt 308.0 lb

## 2023-03-09 DIAGNOSIS — E291 Testicular hypofunction: Secondary | ICD-10-CM | POA: Diagnosis not present

## 2023-03-09 DIAGNOSIS — Z125 Encounter for screening for malignant neoplasm of prostate: Secondary | ICD-10-CM

## 2023-03-09 DIAGNOSIS — N529 Male erectile dysfunction, unspecified: Secondary | ICD-10-CM

## 2023-03-09 NOTE — Patient Instructions (Signed)

## 2023-03-09 NOTE — Progress Notes (Signed)
   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  No prior testosterone values to review.  He reports he has been feeling fatigued, low energy, low libido, problems with erections, and from losing weight over the last few years.  This seems to worsen since he has been on the Suboxone.  He is interested in testosterone replacement options.  He does report some problems with erections, but not enough to consider ED medications at this time.  Reports a family history of BPH, no known family history of prostate cancer, no prior PSA values to review.  He is not interested in any further pregnancies.   PMH: Past Medical History:  Diagnosis Date   Psoriasis 2012   Shingles 2017    Surgical History: Past Surgical History:  Procedure Laterality Date   TONSILLECTOMY  59    Family History: Family History  Problem Relation Age of Onset   Breast cancer Mother        in remission   Cirrhosis Father    COPD Father    Heart attack Father 45   Colon polyps Father        Late 66's   Healthy Brother    Healthy Brother    Healthy Brother     Social History:  reports that he has quit smoking. His smoking use included cigarettes. He has never used smokeless tobacco. He reports that he does not drink alcohol and does not use drugs.  Physical Exam: BP (!) 180/95 (BP Location: Left Arm, Patient Position: Sitting, Cuff Size: Large)   Pulse (!) 105   Ht  (1.88 m)   Wt (!) 308 lb (139.7 kg)   BMI 39.54 kg/m    Constitutional:  Alert and oriented, No acute distress. Cardiovascular: No clubbing, cyanosis, or edema. Respiratory: Normal respiratory effort, no increased work of breathing. 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  Primary symptoms are fatigue, low energy, difficulty losing weight, low libido, and ED.  We reviewed the AUA guidelines regarding evaluation and management of patients with low testosterone, and the need for 2 levels <300 prior to considering replacement.  We also discussed the effect of narcotics on testosterone, as well as behavioral strategies including weight loss, healthy eating, decrease stress, improve sleep, potential evaluation for sleep apnea, and exercise.  He is interested in testosterone replacement.  We discussed options including gels/patches, injections, Testopel, as well as off label treatments like Clomid.  Risk and benefits discussed at length.  Check testosterone, LH, H/H, PSA today RTC with PA to review results and consider testosterone replacement   Legrand Rams, MD 03/09/2023  [facility] [address] [place], [place] [address] [phone]

## 2023-03-10 LAB — LUTEINIZING HORMONE: LH: 2.4 m[IU]/mL (ref 1.7–8.6)

## 2023-03-10 LAB — HEMOGLOBIN AND HEMATOCRIT, BLOOD
Hematocrit: 44.8 % (ref 37.5–51.0)
Hemoglobin: 15.2 g/dL (ref 13.0–17.7)

## 2023-03-10 LAB — TESTOSTERONE: Testosterone: 118 ng/dL — ABNORMAL LOW (ref 264–916)

## 2023-03-10 LAB — PSA: Prostate Specific Ag, Serum: 0.9 ng/mL (ref 0.0–4.0)

## 2023-03-16 ENCOUNTER — Ambulatory Visit: Payer: BC Managed Care – PPO | Admitting: Physician Assistant

## 2023-03-16 ENCOUNTER — Other Ambulatory Visit: Payer: Self-pay | Admitting: Physician Assistant

## 2023-03-16 VITALS — BP 145/94 | HR 89 | Ht 74.0 in | Wt 308.0 lb

## 2023-03-16 DIAGNOSIS — N529 Male erectile dysfunction, unspecified: Secondary | ICD-10-CM

## 2023-03-16 DIAGNOSIS — E291 Testicular hypofunction: Secondary | ICD-10-CM | POA: Diagnosis not present

## 2023-03-16 MED ORDER — BD ECLIPSE NEEDLE 21G X 1-1/2" MISC
1 refills | Status: DC
Start: 2023-03-16 — End: 2023-03-16

## 2023-03-16 MED ORDER — BD ECLIPSE SHIELDED NEEDLE 18G X 1-1/2" MISC
1 refills | Status: DC
Start: 2023-03-16 — End: 2023-03-16

## 2023-03-16 MED ORDER — CLOMIPHENE CITRATE 50 MG PO TABS
ORAL_TABLET | ORAL | 0 refills | Status: DC
Start: 2023-03-16 — End: 2023-05-08

## 2023-03-16 MED ORDER — TESTOSTERONE CYPIONATE 200 MG/ML IM SOLN: 200.0000 mg | INTRAMUSCULAR | 0 refills | Status: DC

## 2023-03-16 MED ORDER — SYRINGE 2-3 ML 3 ML MISC
2 refills | Status: DC
Start: 2023-03-16 — End: 2023-03-16

## 2023-03-16 NOTE — Progress Notes (Signed)
03/16/2023 9:47 AM   Ronald Mccoy July 09, 1971 [id number]  CC: Chief Complaint  Patient presents with   Hypogonadism   HPI: Ronald Mccoy is a 52 y.o. male with PMH hypogonadism and ED who presents today to discuss initiation of TRT.   He has had 2 abnormally low a.m. testosterone levels, 154 on 02/03/2023 and 118 on 03/09/2023.  He describes primarily fatigue and low libido.  He does not desire to preserve fertility.  His wife is a critical care nurse and would likely administer any injections.  PMH: Past Medical History:  Diagnosis Date   Psoriasis 2012   Shingles 2017    Surgical History: Past Surgical History:  Procedure Laterality Date   TONSILLECTOMY  1978    Home Medications:  Allergies as of 03/16/2023   No Known Allergies      Medication List        Accurate as of Mar 16, 2023  9:47 AM. If you have any questions, ask your nurse or doctor.          2-3CC SYRINGE 3 ML Misc Use one syringe every 2 weeks with testosterone injection.   BD Eclipse Needle 21G X 1-1/2" Misc Generic drug: NEEDLE (DISP) 21 G Use one needle every 2 weeks to inject testosterone into the muscle.   BD Eclipse Shielded Needle 18G X 1-1/2" Misc Generic drug: NEEDLE (DISP) 18 G Use one needle every 2 weeks to draw the medication up into the syringe prior to injection.   clobetasol cream 0.05 % Commonly known as: TEMOVATE Apply 1 Application topically 2 (two) times daily.   multivitamin tablet Take 1 tablet by mouth daily.   nystatin ointment Commonly known as: MYCOSTATIN Apply 1 application topically 2 (two) times daily.   PROBIOTIC ADVANCED PO Take by mouth.   Suboxone 8-2 MG Film Generic drug: Buprenorphine HCl-Naloxone HCl Place under the tongue. 1 1/2 films daily   testosterone cypionate 200 MG/ML injection Commonly known as: DEPOTESTOSTERONE CYPIONATE Inject 1 mL (200 mg total) into the muscle every 14 (fourteen) days.   Tremfya 100 MG/ML Sopn Generic drug:  Guselkumab Inject into the skin. Every 8 weeks   Vitamin D (Ergocalciferol) 1.25 MG (50000 UNIT) Caps capsule Commonly known as: DRISDOL Take 50,000 Units by mouth once a week.        Allergies:  No Known Allergies  Family History: Family History  Problem Relation Age of Onset   Breast cancer Mother        in remission   Cirrhosis Father    COPD Father    Heart attack Father 90   Colon polyps Father        Late 56's   Healthy Brother    Healthy Brother    Healthy Brother     Social History:   reports that he has quit smoking. His smoking use included cigarettes. He has never used smokeless tobacco. He reports that he does not drink alcohol and does not use drugs.  Physical Exam: BP (!) 177/76   Pulse 93   Ht 6\' 2"  (1.88 m)   Wt (!) 308 lb (139.7 kg)   BMI 39.54 kg/m   Constitutional:  Alert and oriented, no acute distress, nontoxic appearing HEENT: [place], AT Cardiovascular: No clubbing, cyanosis, or edema Respiratory: Normal respiratory effort, no increased work of breathing Skin: No rashes, bruises or suspicious lesions Neurologic: Grossly intact, no focal deficits, moving all 4 extremities Psychiatric: Normal mood and affect  Laboratory Data: Results for  orders placed or performed in visit on 03/09/23  Testosterone  Result Value Ref Range   Testosterone 118 (L) 264 - 916 ng/dL  Hemoglobin and hematocrit, blood  Result Value Ref Range   Hemoglobin 15.2 13.0 - 17.7 g/dL   Hematocrit 16.1 09.6 - 51.0 %  Luteinizing hormone  Result Value Ref Range   LH 2.4 1.7 - 8.6 mIU/mL  PSA  Result Value Ref Range   Prostate Specific Ag, Serum 0.9 0.0 - 4.0 ng/mL   Assessment & Plan:   1. Hypogonadism in male He now meets diagnostic criteria for hypogonadism with 2 AM testosterones <300ng/mL.  He desires to start TRT.  We discussed risks of TRT including infertility due to decreased or absent spermatogenesis, testicular atrophy, cardiovascular events including MI and  stroke, and decreased endogenous testosterone production.  We discussed various therapies including Clomid, topical gels, and injections.  It looks like his insurance will cover topical gel than IM injections.  We discussed pros and cons of these including daily application with steadier testosterone levels versus biweekly injections.  He wishes to proceed with the latter.  Will start with testosterone cypionate 200 mg/ML, 1 mL every 2 weeks and see him back in clinic tomorrow for injection teaching.  I encouraged him to bring his wife, since she will be the one performing these most likely. - testosterone cypionate (DEPOTESTOSTERONE CYPIONATE) 200 MG/ML injection; Inject 1 mL (200 mg total) into the muscle every 14 (fourteen) days.  Dispense: 10 mL; Refill: 0 - NEEDLE, DISP, 18 G (BD ECLIPSE SHIELDED NEEDLE) 18G X 1-1/2" MISC; Use one needle every 2 weeks to draw the medication up into the syringe prior to injection.  Dispense: 50 each; Refill: 1 - NEEDLE, DISP, 21 G (BD ECLIPSE NEEDLE) 21G X 1-1/2" MISC; Use one needle every 2 weeks to inject testosterone into the muscle.  Dispense: 50 each; Refill: 1 - Syringe, Disposable, (2-3CC SYRINGE) 3 ML MISC; Use one syringe every 2 weeks with testosterone injection.  Dispense: 25 each; Refill: 2  Return in 1 day (on 03/17/2023) for Testosterone injection teaching.  Ronald Ching, PA-C  [facility] [place] [address] [place], [place] [address] [phone]

## 2023-03-16 NOTE — Progress Notes (Signed)
Patient sent MyChart message after his office visit today stating that he would like to try clomiphene after all.  Prescription sent to his pharmacy, will call pharmacy to cancel testosterone cypionate.  Please schedule him for testosterone, H&H, and PSA in 6-8 weeks.

## 2023-03-17 ENCOUNTER — Ambulatory Visit: Payer: BC Managed Care – PPO | Admitting: Physician Assistant

## 2023-03-17 NOTE — Telephone Encounter (Signed)
Ronald Ching, PA-C Physician Assistant Certified Yesterday   Edit   Copy   Patient sent MyChart message after his office visit today stating that he would like to try clomiphene after all.  Prescription sent to his pharmacy, will call pharmacy to cancel testosterone cypionate.   Please schedule him for testosterone, H&H, and PSA in 6-8 weeks.

## 2023-03-22 ENCOUNTER — Telehealth: Payer: Self-pay | Admitting: *Deleted

## 2023-03-22 NOTE — Telephone Encounter (Signed)
Patient was transferred from Trish at endo unit. He need to cancel his colonoscopy due to going on vacation.  Notified Trish of the change.  Patient will call back when he can reschedule by the end of June.

## 2023-03-30 ENCOUNTER — Ambulatory Visit
Admission: RE | Admit: 2023-03-30 | Payer: BC Managed Care – PPO | Source: Ambulatory Visit | Admitting: Gastroenterology

## 2023-03-30 ENCOUNTER — Encounter: Admission: RE | Payer: Self-pay | Source: Ambulatory Visit

## 2023-03-30 ENCOUNTER — Ambulatory Visit: Payer: BC Managed Care – PPO | Admitting: Urology

## 2023-03-30 SURGERY — COLONOSCOPY WITH PROPOFOL
Anesthesia: General

## 2023-04-04 DIAGNOSIS — F411 Generalized anxiety disorder: Secondary | ICD-10-CM | POA: Diagnosis not present

## 2023-04-24 DIAGNOSIS — F411 Generalized anxiety disorder: Secondary | ICD-10-CM | POA: Diagnosis not present

## 2023-04-24 DIAGNOSIS — Z7151 Drug abuse counseling and surveillance of drug abuser: Secondary | ICD-10-CM | POA: Diagnosis not present

## 2023-04-27 ENCOUNTER — Other Ambulatory Visit: Payer: BC Managed Care – PPO

## 2023-05-02 DIAGNOSIS — F411 Generalized anxiety disorder: Secondary | ICD-10-CM | POA: Diagnosis not present

## 2023-05-08 ENCOUNTER — Other Ambulatory Visit: Payer: Self-pay | Admitting: *Deleted

## 2023-05-08 DIAGNOSIS — E291 Testicular hypofunction: Secondary | ICD-10-CM

## 2023-05-08 NOTE — Telephone Encounter (Signed)
From: [name] To: Office of [place], [place] Sent: 05/08/2023 8:52 AM EDT Subject: Medication Renewal Request  Refills have been requested for the following medications:   clomiPHENE (CLOMID) 50 MG tablet [Samantha Vaillancourt]  Preferred pharmacy: [id number] - [place], [place] - [address] Delivery method: Daryll Drown

## 2023-05-09 MED ORDER — CLOMIPHENE CITRATE 50 MG PO TABS: ORAL_TABLET | ORAL | 0 refills | Status: DC

## 2023-05-10 ENCOUNTER — Other Ambulatory Visit: Payer: BC Managed Care – PPO

## 2023-05-10 DIAGNOSIS — E291 Testicular hypofunction: Secondary | ICD-10-CM | POA: Diagnosis not present

## 2023-05-10 DIAGNOSIS — N529 Male erectile dysfunction, unspecified: Secondary | ICD-10-CM

## 2023-05-11 LAB — PSA: Prostate Specific Ag, Serum: 1.2 ng/mL (ref 0.0–4.0)

## 2023-05-11 LAB — HEMOGLOBIN AND HEMATOCRIT, BLOOD
Hematocrit: 46.7 % (ref 37.5–51.0)
Hemoglobin: 15.7 g/dL (ref 13.0–17.7)

## 2023-05-11 LAB — TESTOSTERONE: Testosterone: 604 ng/dL (ref 264–916)

## 2023-06-06 DIAGNOSIS — F411 Generalized anxiety disorder: Secondary | ICD-10-CM | POA: Diagnosis not present

## 2023-06-06 DIAGNOSIS — F1110 Opioid abuse, uncomplicated: Secondary | ICD-10-CM | POA: Diagnosis not present

## 2023-06-06 DIAGNOSIS — F1120 Opioid dependence, uncomplicated: Secondary | ICD-10-CM | POA: Diagnosis not present

## 2023-06-19 DIAGNOSIS — Z7151 Drug abuse counseling and surveillance of drug abuser: Secondary | ICD-10-CM | POA: Diagnosis not present

## 2023-06-19 DIAGNOSIS — F411 Generalized anxiety disorder: Secondary | ICD-10-CM | POA: Diagnosis not present

## 2023-07-04 DIAGNOSIS — F411 Generalized anxiety disorder: Secondary | ICD-10-CM | POA: Diagnosis not present

## 2023-07-06 ENCOUNTER — Encounter: Payer: Self-pay | Admitting: Family Medicine

## 2023-07-11 ENCOUNTER — Encounter: Payer: Self-pay | Admitting: Urology

## 2023-07-13 ENCOUNTER — Ambulatory Visit: Payer: BC Managed Care – PPO | Admitting: Family Medicine

## 2023-08-01 DIAGNOSIS — F1110 Opioid abuse, uncomplicated: Secondary | ICD-10-CM | POA: Diagnosis not present

## 2023-08-01 DIAGNOSIS — F411 Generalized anxiety disorder: Secondary | ICD-10-CM | POA: Diagnosis not present

## 2023-08-01 DIAGNOSIS — F1120 Opioid dependence, uncomplicated: Secondary | ICD-10-CM | POA: Diagnosis not present

## 2023-08-07 ENCOUNTER — Other Ambulatory Visit: Payer: Self-pay

## 2023-08-07 DIAGNOSIS — E291 Testicular hypofunction: Secondary | ICD-10-CM

## 2023-08-08 ENCOUNTER — Other Ambulatory Visit: Payer: BC Managed Care – PPO

## 2023-08-08 DIAGNOSIS — E291 Testicular hypofunction: Secondary | ICD-10-CM

## 2023-08-09 LAB — TESTOSTERONE: Testosterone: 427 ng/dL (ref 264–916)

## 2023-08-10 ENCOUNTER — Ambulatory Visit: Payer: BC Managed Care – PPO | Admitting: Urology

## 2023-08-16 ENCOUNTER — Encounter: Payer: Self-pay | Admitting: Urology

## 2023-08-16 ENCOUNTER — Ambulatory Visit: Payer: BC Managed Care – PPO | Admitting: Urology

## 2023-08-16 VITALS — BP 162/94 | HR 90 | Ht 74.0 in | Wt 310.4 lb

## 2023-08-16 DIAGNOSIS — N529 Male erectile dysfunction, unspecified: Secondary | ICD-10-CM | POA: Diagnosis not present

## 2023-08-16 DIAGNOSIS — E291 Testicular hypofunction: Secondary | ICD-10-CM

## 2023-08-16 MED ORDER — TADALAFIL 5 MG PO TABS
5.0000 mg | ORAL_TABLET | Freq: Every day | ORAL | 11 refills | Status: DC | PRN
Start: 2023-08-16 — End: 2024-03-01

## 2023-08-16 MED ORDER — CLOMIPHENE CITRATE 50 MG PO TABS
ORAL_TABLET | ORAL | 6 refills | Status: DC
Start: 2023-08-16 — End: 2024-06-19

## 2023-08-16 NOTE — Progress Notes (Signed)
   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  He ultimately opted for trial of Clomid 25 mg daily and has done well on that medication.  He reports significant improvement in his symptoms.  Testosterone initially increased to 604 with normal PSA of 1.2, H&H normal.  Repeat testosterone from September 2024 was 427, but he admitted to decreasing his Clomid frequency secondary to headache, but since improving his hydration his headache has resolved and he is back on the normal Clomid dosing.  He would like to continue the Clomid 25 mg daily.  He was also interested in a trial of medications for ED.  Risk and benefits of Cialis discussed, and Cialis 5 to 10 mg on demand was sent in.  Continue Clomid 25 mg daily, refilled Trial of Cialis 5 to 10 mg on demand RTC 6 months testosterone, H&H, CMP, PSA prior, if doing well can space to yearly follow-up   Sondra Come, MD  [facility] [address], [address] [place], [place] [address] [phone]

## 2023-08-28 DIAGNOSIS — Z7151 Drug abuse counseling and surveillance of drug abuser: Secondary | ICD-10-CM | POA: Diagnosis not present

## 2023-08-28 DIAGNOSIS — F411 Generalized anxiety disorder: Secondary | ICD-10-CM | POA: Diagnosis not present

## 2023-08-29 DIAGNOSIS — F411 Generalized anxiety disorder: Secondary | ICD-10-CM | POA: Diagnosis not present

## 2023-09-06 DIAGNOSIS — Z872 Personal history of diseases of the skin and subcutaneous tissue: Secondary | ICD-10-CM | POA: Diagnosis not present

## 2023-09-06 DIAGNOSIS — L578 Other skin changes due to chronic exposure to nonionizing radiation: Secondary | ICD-10-CM | POA: Diagnosis not present

## 2023-09-06 DIAGNOSIS — Z86018 Personal history of other benign neoplasm: Secondary | ICD-10-CM | POA: Diagnosis not present

## 2023-09-06 DIAGNOSIS — Z85828 Personal history of other malignant neoplasm of skin: Secondary | ICD-10-CM | POA: Diagnosis not present

## 2023-09-26 DIAGNOSIS — F1120 Opioid dependence, uncomplicated: Secondary | ICD-10-CM | POA: Diagnosis not present

## 2023-09-26 DIAGNOSIS — F1111 Opioid abuse, in remission: Secondary | ICD-10-CM | POA: Diagnosis not present

## 2023-09-26 DIAGNOSIS — F411 Generalized anxiety disorder: Secondary | ICD-10-CM | POA: Diagnosis not present

## 2023-10-06 DIAGNOSIS — Z79899 Other long term (current) drug therapy: Secondary | ICD-10-CM | POA: Diagnosis not present

## 2023-10-23 DIAGNOSIS — F411 Generalized anxiety disorder: Secondary | ICD-10-CM | POA: Diagnosis not present

## 2023-10-23 DIAGNOSIS — F1121 Opioid dependence, in remission: Secondary | ICD-10-CM | POA: Diagnosis not present

## 2023-10-23 DIAGNOSIS — Z7151 Drug abuse counseling and surveillance of drug abuser: Secondary | ICD-10-CM | POA: Diagnosis not present

## 2023-10-24 DIAGNOSIS — F411 Generalized anxiety disorder: Secondary | ICD-10-CM | POA: Diagnosis not present

## 2023-11-28 DIAGNOSIS — F1120 Opioid dependence, uncomplicated: Secondary | ICD-10-CM | POA: Diagnosis not present

## 2023-11-28 DIAGNOSIS — F411 Generalized anxiety disorder: Secondary | ICD-10-CM | POA: Diagnosis not present

## 2023-11-28 DIAGNOSIS — F1111 Opioid abuse, in remission: Secondary | ICD-10-CM | POA: Diagnosis not present

## 2023-12-26 DIAGNOSIS — F1121 Opioid dependence, in remission: Secondary | ICD-10-CM | POA: Diagnosis not present

## 2023-12-26 DIAGNOSIS — F411 Generalized anxiety disorder: Secondary | ICD-10-CM | POA: Diagnosis not present

## 2023-12-26 DIAGNOSIS — Z7151 Drug abuse counseling and surveillance of drug abuser: Secondary | ICD-10-CM | POA: Diagnosis not present

## 2024-01-15 ENCOUNTER — Encounter: Payer: Self-pay | Admitting: Family Medicine

## 2024-01-23 DIAGNOSIS — F411 Generalized anxiety disorder: Secondary | ICD-10-CM | POA: Diagnosis not present

## 2024-02-14 ENCOUNTER — Encounter: Payer: Self-pay | Admitting: Family Medicine

## 2024-02-15 ENCOUNTER — Ambulatory Visit: Payer: Self-pay | Admitting: Urology

## 2024-02-20 DIAGNOSIS — F411 Generalized anxiety disorder: Secondary | ICD-10-CM | POA: Diagnosis not present

## 2024-02-20 DIAGNOSIS — Z7151 Drug abuse counseling and surveillance of drug abuser: Secondary | ICD-10-CM | POA: Diagnosis not present

## 2024-03-01 ENCOUNTER — Ambulatory Visit (INDEPENDENT_AMBULATORY_CARE_PROVIDER_SITE_OTHER): Admitting: Family Medicine

## 2024-03-01 ENCOUNTER — Other Ambulatory Visit: Payer: Self-pay | Admitting: Medical Genetics

## 2024-03-01 VITALS — BP 135/80 | HR 85 | Ht 74.0 in | Wt 306.2 lb

## 2024-03-01 DIAGNOSIS — R0681 Apnea, not elsewhere classified: Secondary | ICD-10-CM | POA: Diagnosis not present

## 2024-03-01 DIAGNOSIS — Z125 Encounter for screening for malignant neoplasm of prostate: Secondary | ICD-10-CM

## 2024-03-01 DIAGNOSIS — Z0000 Encounter for general adult medical examination without abnormal findings: Secondary | ICD-10-CM

## 2024-03-01 DIAGNOSIS — L409 Psoriasis, unspecified: Secondary | ICD-10-CM

## 2024-03-01 DIAGNOSIS — R0683 Snoring: Secondary | ICD-10-CM

## 2024-03-01 DIAGNOSIS — Z1211 Encounter for screening for malignant neoplasm of colon: Secondary | ICD-10-CM

## 2024-03-01 DIAGNOSIS — R7303 Prediabetes: Secondary | ICD-10-CM

## 2024-03-01 DIAGNOSIS — F1120 Opioid dependence, uncomplicated: Secondary | ICD-10-CM

## 2024-03-01 DIAGNOSIS — Z86006 Personal history of melanoma in-situ: Secondary | ICD-10-CM

## 2024-03-01 NOTE — Assessment & Plan Note (Signed)
 Recommend low carb diet Recheck A1c

## 2024-03-01 NOTE — Assessment & Plan Note (Signed)
 Discussed importance of healthy weight management Discussed diet and exercise

## 2024-03-01 NOTE — Progress Notes (Signed)
 Complete physical exam   Patient: Ronald Mccoy   DOB: December 20, 1970   52 y.o. Male  MRN: [id number] Visit Date: 03/01/2024  Today's healthcare provider: Jon Eva, MD   Chief Complaint  Patient presents with   Annual Exam    Diet -  eating at least once a day Exercise - walking 20-30 minutes daily Feeling - well Sleeping - well Concerns - none   Subjective    Ronald Mccoy is a 53 y.o. male who presents today for a complete physical exam.   Discussed the use of AI scribe software for clinical note transcription with the patient, who gave verbal consent to proceed.  History of Present Illness   The patient, with a history of melanoma and psoriasis, presents with anxiety about an upcoming dental implant surgery involving IV sedation. He expresses concerns about the safety of the procedure and the sedation, as he has never experienced it before. The patient also reports occasional episodes of waking up gasping for breath, which he believes may be due to sleep apnea. He expresses interest in a sleep study to confirm this. 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 He has been delaying his Tremfya doses due to concerns about the upcoming surgery. The patient also mentions being on Suboxone, which he is trying to reduce due to concerns about long-term use. He also expresses interest in scheduling a colonoscopy after his dental surgery.        Last depression screening scores    01/10/2023    3:50 PM 12/31/2019   10:06 AM 12/28/2018    3:00 PM  PHQ 2/9 Scores  PHQ - 2 Score 0 0 0  PHQ- 9 Score 0 0 0   Last fall risk screening    01/10/2023    3:50 PM  Fall Risk   Falls in the past year? 0  Number falls in past yr: 0  Injury with Fall? 0  Risk for fall due to : No Fall Risks  Follow up Falls evaluation completed        Medications: Outpatient Medications Prior to Visit  Medication Sig    Buprenorphine HCl-Naloxone HCl (SUBOXONE) 8-2 MG FILM Place under the tongue. 1 1/2 films daily   clobetasol  cream (TEMOVATE ) 0.05 % Apply 1 Application topically 2 (two) times daily.   clomiPHENE  (CLOMID ) 50 MG tablet Take 1/2 tablet daily   Multiple Vitamin (MULTIVITAMIN) tablet Take 1 tablet by mouth daily.   Probiotic Product (PROBIOTIC ADVANCED PO) Take by mouth.   TREMFYA 100 MG/ML SOPN Inject into the skin. Every 8 weeks   Vitamin D, Ergocalciferol, (DRISDOL) 1.25 MG (50000 UNIT) CAPS capsule Take 50,000 Units by mouth once a week.   tadalafil  (CIALIS ) 5 MG tablet Take 1-2 tablets (5-10 mg total) by mouth daily as needed for erectile dysfunction (take 45 minutes prior to sexual activity).   No facility-administered medications prior to visit.    Review of Systems    Objective    BP 135/80 Comment: home reading  Pulse 85   Ht 6' 2 (1.88 m)   Wt (!) 306 lb 3.2 oz (138.9 kg)   SpO2 100%   BMI 39.31 kg/m    Physical Exam Vitals reviewed.  Constitutional:      General: He is not in acute distress.    Appearance: Normal appearance. He is well-developed. He is not diaphoretic.  HENT:  Head: Normocephalic and atraumatic.     Right Ear: Tympanic membrane, ear canal and external ear normal.     Left Ear: Tympanic membrane, ear canal and external ear normal.     Nose: Nose normal.     Mouth/Throat:     Mouth: Mucous membranes are moist.     Pharynx: Oropharynx is clear. No oropharyngeal exudate.  Eyes:     General: No scleral icterus.    Conjunctiva/sclera: Conjunctivae normal.     Pupils: Pupils are equal, round, and reactive to light.  Neck:     Thyroid : No thyromegaly.  Cardiovascular:     Rate and Rhythm: Normal rate and regular rhythm.     Heart sounds: Normal heart sounds. No murmur heard. Pulmonary:     Effort: Pulmonary effort is normal. No respiratory distress.     Breath sounds: Normal breath sounds. No wheezing or rales.  Abdominal:     General: There  is no distension.     Palpations: Abdomen is soft.     Tenderness: There is no abdominal tenderness.  Musculoskeletal:        General: No deformity.     Cervical back: Neck supple.     Right lower leg: No edema.     Left lower leg: No edema.  Lymphadenopathy:     Cervical: No cervical adenopathy.  Skin:    General: Skin is warm and dry.     Findings: No rash.  Neurological:     Mental Status: He is alert and oriented to person, place, and time. Mental status is at baseline.     Gait: Gait normal.  Psychiatric:        Mood and Affect: Mood normal.        Behavior: Behavior normal.        Thought Content: Thought content normal.      No results found for any visits on 03/01/24.  Assessment & Plan    Routine Health Maintenance and Physical Exam  Exercise Activities and Dietary recommendations  Goals   None     Immunization History  Administered Date(s) Administered   Influenza,inj,Quad PF,6+ Mos 07/17/2019   Influenza-Unspecified 06/26/2023    Health Maintenance  Topic Date Due   COVID-19 Vaccine (1) Never done   DTaP/Tdap/Td (1 - Tdap) Never done   Zoster Vaccines- Shingrix (1 of 2) Never done   Colonoscopy  Never done   INFLUENZA VACCINE  06/14/2024   Hepatitis C Screening  Completed   HIV Screening  Completed   HPV VACCINES  Aged Out   Meningococcal B Vaccine  Aged Out    Discussed health benefits of physical activity, and encouraged him to engage in regular exercise appropriate for his age and condition.  Problem List Items Addressed This Visit       Musculoskeletal and Integument   Psoriasis   H/O melanoma in situ     Other   Morbid obesity (HCC)   Discussed importance of healthy weight management Discussed diet and exercise       Relevant Orders   Comprehensive metabolic panel with GFR   Lipid panel   Narcotic addiction (HCC)   Prediabetes   Recommend low carb diet Recheck A1c       Relevant Orders   Hemoglobin A1c   Other Visit  Diagnoses       Encounter for annual physical exam    -  Primary   Relevant Orders   Hemoglobin A1c   Comprehensive metabolic panel  with GFR   Lipid panel   PSA Total (Reflex To Free)     Witnessed episode of apnea       Relevant Orders   Ambulatory referral to Sleep Studies     Snoring       Relevant Orders   Ambulatory referral to Sleep Studies     Colon cancer screening       Relevant Orders   Ambulatory referral to Gastroenterology     Prostate cancer screening       Relevant Orders   PSA Total (Reflex To Free)           Preoperative Evaluation   Scheduled for dental implant surgery with IV sedation. Anxious about sedation due to lack of prior experience. Discussed safety of twilight sedation, which allows spontaneous breathing and is monitored by a nurse anesthetist. Reassured about safety and monitoring during the procedure. Discussed potential for sleep apnea during surgery and reassured about continuous monitoring of breathing.    suspected Obstructive Sleep Apnea (OSA)   Reports occasional episodes of waking up gasping for air, especially when lying back too far in a recliner. Episodes have decreased since losing weight. Previously refused a sleep study at another center but interested in pursuing one now. Discussed possibility of silent apnea occurring throughout the night. Recommended home sleep study for comfort and accurate assessment in usual sleeping environment.   - Order home sleep study   - Complete [place] Sleepiness Scale questionnaire    Psoriasis   On Tremfya for psoriasis but delayed dose due to upcoming surgery. Discussed holding Tremfya before surgery due to infection risk. Advised to contact dermatologist to determine appropriate duration to hold Tremfya before surgery.   - Contact dermatologist to determine appropriate duration to hold Tremfya before surgery    Substance Use Disorder (Suboxone use)   On Suboxone for opioid use disorder, currently  self-adjusting dose between 4-12 mg. Experiences heart palpitations at higher doses and is attempting to reduce dose. Discussed importance of communicating dose changes with prescribing physician. Plans to reduce dose to 4 mg post-surgery if stable.   - Inform Suboxone prescriber of any dose changes    Hypertension   Experiences elevated blood pressure readings in clinical settings, likely due to anxiety (white coat syndrome). Home readings are in the 130s/80s. Not on antihypertensive medication and managing blood pressure through lifestyle changes, including weight loss and daily walking.   - Monitor blood pressure at home   - Reassess blood pressure management if home readings become elevated    General Health Maintenance   Due for routine lab work including A1c, cholesterol, kidney and liver function tests. Aware of shingles and tetanus vaccines but prefers to delay them until after surgery. Participating in a DNA study for predisposition to certain conditions.   - Order routine lab work including A1c, cholesterol, kidney and liver function tests   - Delay shingles and tetanus vaccines until after surgery    Follow-up   Plans to pursue a colonoscopy after dental surgery is completed. Discussed referral options for colonoscopy due to previous provider's unavailability. Agreed to referral to [facility] for colonoscopy.   - Schedule follow-up appointment for April next year   - Refer to [facility] for colonoscopy after dental surgery       Return in about 1 year (around 03/01/2025) for CPE.     Jon Eva, MD  [facility] [phone] (phone) [phone] (fax)  [facility]

## 2024-03-02 LAB — COMPREHENSIVE METABOLIC PANEL WITH GFR
ALT: 21 IU/L (ref 0–44)
AST: 24 IU/L (ref 0–40)
Albumin: 4.3 g/dL (ref 3.8–4.9)
Alkaline Phosphatase: 78 IU/L (ref 44–121)
BUN/Creatinine Ratio: 12 (ref 9–20)
BUN: 10 mg/dL (ref 6–24)
Bilirubin Total: 0.3 mg/dL (ref 0.0–1.2)
CO2: 26 mmol/L (ref 20–29)
Calcium: 9.5 mg/dL (ref 8.7–10.2)
Chloride: 100 mmol/L (ref 96–106)
Creatinine, Ser: 0.81 mg/dL (ref 0.76–1.27)
Globulin, Total: 3.4 g/dL (ref 1.5–4.5)
Glucose: 116 mg/dL — ABNORMAL HIGH (ref 70–99)
Potassium: 4.9 mmol/L (ref 3.5–5.2)
Sodium: 140 mmol/L (ref 134–144)
Total Protein: 7.7 g/dL (ref 6.0–8.5)
eGFR: 107 mL/min/{1.73_m2} (ref >59)

## 2024-03-02 LAB — PSA TOTAL (REFLEX TO FREE): Prostate Specific Ag, Serum: 1 ng/mL (ref 0.0–4.0)

## 2024-03-02 LAB — HEMOGLOBIN A1C
Est. average glucose Bld gHb Est-mCnc: 128 mg/dL
Hgb A1c MFr Bld: 6.1 % — ABNORMAL HIGH (ref 4.8–5.6)

## 2024-03-02 LAB — LIPID PANEL
Chol/HDL Ratio: 3.3 ratio (ref 0.0–5.0)
Cholesterol, Total: 136 mg/dL (ref 100–199)
HDL: 41 mg/dL (ref >39)
LDL Chol Calc (NIH): 77 mg/dL (ref 0–99)
Triglycerides: 94 mg/dL (ref 0–149)
VLDL Cholesterol Cal: 18 mg/dL (ref 5–40)

## 2024-03-04 ENCOUNTER — Encounter: Payer: Self-pay | Admitting: Family Medicine

## 2024-03-06 DIAGNOSIS — L400 Psoriasis vulgaris: Secondary | ICD-10-CM | POA: Diagnosis not present

## 2024-03-06 DIAGNOSIS — Z79899 Other long term (current) drug therapy: Secondary | ICD-10-CM | POA: Diagnosis not present

## 2024-03-06 DIAGNOSIS — D492 Neoplasm of unspecified behavior of bone, soft tissue, and skin: Secondary | ICD-10-CM | POA: Diagnosis not present

## 2024-03-19 DIAGNOSIS — F1111 Opioid abuse, in remission: Secondary | ICD-10-CM | POA: Diagnosis not present

## 2024-03-19 DIAGNOSIS — F411 Generalized anxiety disorder: Secondary | ICD-10-CM | POA: Diagnosis not present

## 2024-03-22 ENCOUNTER — Other Ambulatory Visit

## 2024-04-16 DIAGNOSIS — Z7151 Drug abuse counseling and surveillance of drug abuser: Secondary | ICD-10-CM | POA: Diagnosis not present

## 2024-04-16 DIAGNOSIS — F1111 Opioid abuse, in remission: Secondary | ICD-10-CM | POA: Diagnosis not present

## 2024-04-16 DIAGNOSIS — F1120 Opioid dependence, uncomplicated: Secondary | ICD-10-CM | POA: Diagnosis not present

## 2024-04-16 DIAGNOSIS — F11120 Opioid abuse with intoxication, uncomplicated: Secondary | ICD-10-CM | POA: Diagnosis not present

## 2024-04-16 DIAGNOSIS — F411 Generalized anxiety disorder: Secondary | ICD-10-CM | POA: Diagnosis not present

## 2024-05-02 ENCOUNTER — Ambulatory Visit: Admitting: Urology

## 2024-05-14 DIAGNOSIS — F411 Generalized anxiety disorder: Secondary | ICD-10-CM | POA: Diagnosis not present

## 2024-05-14 DIAGNOSIS — F1121 Opioid dependence, in remission: Secondary | ICD-10-CM | POA: Diagnosis not present

## 2024-06-11 DIAGNOSIS — F1121 Opioid dependence, in remission: Secondary | ICD-10-CM | POA: Diagnosis not present

## 2024-06-11 DIAGNOSIS — Z7151 Drug abuse counseling and surveillance of drug abuser: Secondary | ICD-10-CM | POA: Diagnosis not present

## 2024-06-11 DIAGNOSIS — F411 Generalized anxiety disorder: Secondary | ICD-10-CM | POA: Diagnosis not present

## 2024-06-19 ENCOUNTER — Ambulatory Visit: Admitting: Urology

## 2024-06-19 ENCOUNTER — Encounter: Payer: Self-pay | Admitting: Urology

## 2024-06-19 ENCOUNTER — Other Ambulatory Visit: Payer: Self-pay

## 2024-06-19 VITALS — BP 152/83 | HR 102 | Ht 75.0 in | Wt 287.0 lb

## 2024-06-19 DIAGNOSIS — E291 Testicular hypofunction: Secondary | ICD-10-CM

## 2024-06-19 DIAGNOSIS — Z125 Encounter for screening for malignant neoplasm of prostate: Secondary | ICD-10-CM

## 2024-06-19 MED ORDER — CLOMIPHENE CITRATE 50 MG PO TABS: ORAL_TABLET | ORAL | 6 refills | Status: AC

## 2024-06-19 NOTE — Progress Notes (Signed)
   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  He ultimately opted for trial of Clomid  25 mg daily and has done well on that medication.  He reports significant improvement in his symptoms.  Testosterone  initially increased to 604 with normal PSA of 1.2, H&H normal.  Repeat testosterone  from September 2024 was 427, but he admitted to decreasing his Clomid  frequency secondary to headache, but since improving his hydration his headache has resolved and he is back on the normal Clomid  dosing.  He would like to continue the Clomid  25 mg daily.  Previously we have prescribed Cialis  for ED, however he has not needed that medication and denies any ED when taking the Clomid  as prescribed.  Recent PSA from April 2025 normal at 1.0 and stable from prior, risk benefits of screening were discussed.  Will check testosterone  today and call with results.  Continue Clomid  25 mg daily, refilled RTC 1 year testosterone , H&H, SA prior   Redell JAYSON Burnet, MD  [facility] [address] [place], [place] [address] [phone]

## 2024-06-19 NOTE — Addendum Note (Signed)
 Addended by: Finneas Mathe M on: 06/19/2024 09:19 AM   Modules accepted: Orders

## 2024-06-19 NOTE — Patient Instructions (Signed)

## 2024-06-20 ENCOUNTER — Ambulatory Visit: Payer: Self-pay | Admitting: Urology

## 2024-06-20 LAB — TESTOSTERONE: Testosterone: 410 ng/dL (ref 264–916)

## 2024-06-21 DIAGNOSIS — Z83719 Family history of colon polyps, unspecified: Secondary | ICD-10-CM | POA: Diagnosis not present

## 2024-06-21 DIAGNOSIS — Z1211 Encounter for screening for malignant neoplasm of colon: Secondary | ICD-10-CM | POA: Diagnosis not present

## 2024-07-19 ENCOUNTER — Ambulatory Visit: Payer: Self-pay

## 2024-07-19 DIAGNOSIS — K5140 Inflammatory polyps of colon without complications: Secondary | ICD-10-CM | POA: Diagnosis not present

## 2024-07-19 DIAGNOSIS — Z83719 Family history of colon polyps, unspecified: Secondary | ICD-10-CM | POA: Diagnosis not present

## 2024-07-19 DIAGNOSIS — Z1211 Encounter for screening for malignant neoplasm of colon: Secondary | ICD-10-CM | POA: Diagnosis not present

## 2024-07-19 DIAGNOSIS — K621 Rectal polyp: Secondary | ICD-10-CM | POA: Diagnosis not present

## 2024-07-19 DIAGNOSIS — K640 First degree hemorrhoids: Secondary | ICD-10-CM | POA: Diagnosis not present

## 2024-07-23 DIAGNOSIS — F1120 Opioid dependence, uncomplicated: Secondary | ICD-10-CM | POA: Diagnosis not present

## 2024-07-23 DIAGNOSIS — F1111 Opioid abuse, in remission: Secondary | ICD-10-CM | POA: Diagnosis not present

## 2024-07-23 DIAGNOSIS — F1121 Opioid dependence, in remission: Secondary | ICD-10-CM | POA: Diagnosis not present

## 2024-07-23 DIAGNOSIS — F411 Generalized anxiety disorder: Secondary | ICD-10-CM | POA: Diagnosis not present

## 2024-08-20 DIAGNOSIS — F411 Generalized anxiety disorder: Secondary | ICD-10-CM | POA: Diagnosis not present

## 2024-08-20 DIAGNOSIS — Z7151 Drug abuse counseling and surveillance of drug abuser: Secondary | ICD-10-CM | POA: Diagnosis not present

## 2024-08-20 DIAGNOSIS — F1121 Opioid dependence, in remission: Secondary | ICD-10-CM | POA: Diagnosis not present

## 2024-09-05 DIAGNOSIS — Z86018 Personal history of other benign neoplasm: Secondary | ICD-10-CM | POA: Diagnosis not present

## 2024-09-05 DIAGNOSIS — Z85828 Personal history of other malignant neoplasm of skin: Secondary | ICD-10-CM | POA: Diagnosis not present

## 2024-09-05 DIAGNOSIS — Z872 Personal history of diseases of the skin and subcutaneous tissue: Secondary | ICD-10-CM | POA: Diagnosis not present

## 2024-09-05 DIAGNOSIS — L578 Other skin changes due to chronic exposure to nonionizing radiation: Secondary | ICD-10-CM | POA: Diagnosis not present

## 2024-09-06 ENCOUNTER — Other Ambulatory Visit: Payer: Self-pay | Admitting: Medical Genetics

## 2024-09-06 DIAGNOSIS — Z006 Encounter for examination for normal comparison and control in clinical research program: Secondary | ICD-10-CM

## 2024-09-17 DIAGNOSIS — F411 Generalized anxiety disorder: Secondary | ICD-10-CM | POA: Diagnosis not present

## 2024-09-17 DIAGNOSIS — F1121 Opioid dependence, in remission: Secondary | ICD-10-CM | POA: Diagnosis not present

## 2024-10-15 DIAGNOSIS — Z7151 Drug abuse counseling and surveillance of drug abuser: Secondary | ICD-10-CM | POA: Diagnosis not present

## 2024-10-15 DIAGNOSIS — F411 Generalized anxiety disorder: Secondary | ICD-10-CM | POA: Diagnosis not present

## 2024-10-15 DIAGNOSIS — F1121 Opioid dependence, in remission: Secondary | ICD-10-CM | POA: Diagnosis not present

## 2024-10-15 DIAGNOSIS — F1120 Opioid dependence, uncomplicated: Secondary | ICD-10-CM | POA: Diagnosis not present

## 2025-06-19 ENCOUNTER — Other Ambulatory Visit

## 2025-06-25 ENCOUNTER — Ambulatory Visit: Admitting: Urology
